# Patient Record
Sex: Female | Born: 2000 | Race: White | Hispanic: No | Smoking: Never smoker
Health system: Southern US, Community
[De-identification: ages and names within clinical notes are randomized; demographics above are authoritative.]

## PROBLEM LIST (undated history)

## (undated) ENCOUNTER — Inpatient Hospital Stay (HOSPITAL_COMMUNITY): Payer: Self-pay

## (undated) DIAGNOSIS — F609 Personality disorder, unspecified: Secondary | ICD-10-CM

## (undated) DIAGNOSIS — F4001 Agoraphobia with panic disorder: Secondary | ICD-10-CM

## (undated) DIAGNOSIS — F419 Anxiety disorder, unspecified: Secondary | ICD-10-CM

## (undated) DIAGNOSIS — F909 Attention-deficit hyperactivity disorder, unspecified type: Secondary | ICD-10-CM

## (undated) DIAGNOSIS — F959 Tic disorder, unspecified: Secondary | ICD-10-CM

## (undated) DIAGNOSIS — F32A Depression, unspecified: Secondary | ICD-10-CM

## (undated) DIAGNOSIS — F502 Bulimia nervosa, unspecified: Secondary | ICD-10-CM

## (undated) DIAGNOSIS — F329 Major depressive disorder, single episode, unspecified: Secondary | ICD-10-CM

## (undated) DIAGNOSIS — F4310 Post-traumatic stress disorder, unspecified: Secondary | ICD-10-CM

## (undated) DIAGNOSIS — K589 Irritable bowel syndrome without diarrhea: Secondary | ICD-10-CM

## (undated) HISTORY — DX: Irritable bowel syndrome, unspecified: K58.9

## (undated) HISTORY — DX: Anxiety disorder, unspecified: F41.9

## (undated) HISTORY — DX: Agoraphobia with panic disorder: F40.01

## (undated) HISTORY — DX: Attention-deficit hyperactivity disorder, unspecified type: F90.9

## (undated) HISTORY — DX: Personality disorder, unspecified: F60.9

## (undated) HISTORY — PX: CHOLECYSTECTOMY: SHX55

## (undated) HISTORY — PX: OTHER SURGICAL HISTORY: SHX169

## (undated) HISTORY — DX: Post-traumatic stress disorder, unspecified: F43.10

## (undated) HISTORY — DX: Depression, unspecified: F32.A

## (undated) HISTORY — DX: Bulimia nervosa, unspecified: F50.20

## (undated) HISTORY — DX: Major depressive disorder, single episode, unspecified: F32.9

## (undated) HISTORY — DX: Tic disorder, unspecified: F95.9

## (undated) HISTORY — PX: TYMPANOSTOMY TUBE PLACEMENT: SHX32

## (undated) HISTORY — PX: COLONOSCOPY: SHX174

---
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
## 2022-01-07 ENCOUNTER — Ambulatory Visit: Payer: Self-pay

## 2022-01-18 DIAGNOSIS — Z419 Encounter for procedure for purposes other than remedying health state, unspecified: Secondary | ICD-10-CM | POA: Diagnosis not present

## 2022-02-16 ENCOUNTER — Ambulatory Visit: Payer: 59 | Admitting: Adult Health

## 2022-02-17 ENCOUNTER — Ambulatory Visit: Payer: Medicaid Other | Admitting: Adult Health

## 2022-02-18 DIAGNOSIS — Z419 Encounter for procedure for purposes other than remedying health state, unspecified: Secondary | ICD-10-CM | POA: Diagnosis not present

## 2022-03-19 DIAGNOSIS — Z419 Encounter for procedure for purposes other than remedying health state, unspecified: Secondary | ICD-10-CM | POA: Diagnosis not present

## 2022-04-04 ENCOUNTER — Ambulatory Visit: Payer: Self-pay

## 2022-04-05 ENCOUNTER — Telehealth: Payer: Self-pay | Admitting: Family Medicine

## 2022-04-05 ENCOUNTER — Other Ambulatory Visit: Payer: Self-pay | Admitting: *Deleted

## 2022-04-05 NOTE — Telephone Encounter (Signed)
Patient had symptoms on 04/02/22  congested,cough high fever ,this morning still congested did a home tested this morning positive for Covid. [place]

## 2022-04-05 NOTE — Telephone Encounter (Signed)
Nurses Please do a thorough triage regarding her symptoms She may have a prescription for albuterol 2 puffs every 4 hours as needed Please document if she is having significant breathing difficulties, passing out anything alarming she should be checked right away here or ER Otherwise most individuals with COVID gradually get better without Paxlovid at her age, if she feels she needs to be seen please let me know we can accommodate as best as possible Otherwise I would recommend home care over the next several days it is wise to stay away from others and wear a mask when around others for the next 10 days if she needs a work note please give her a work note

## 2022-04-05 NOTE — Telephone Encounter (Signed)
Patient states she did get Paxlovid via telehealth visit and is having SOB or losing her breath when she sit up or tries to do anything but typically has this issue when she gets sick

## 2022-04-05 NOTE — Telephone Encounter (Signed)
Patient scheduled office visit 04/06/22 at 11:40 pm with Dr Nicki Reaper

## 2022-04-05 NOTE — Telephone Encounter (Signed)
Pt is wanting to know if a inhaler can be called in

## 2022-04-05 NOTE — Telephone Encounter (Signed)
11:40 AM on Tuesday is available to be seen thank you

## 2022-04-06 ENCOUNTER — Ambulatory Visit (INDEPENDENT_AMBULATORY_CARE_PROVIDER_SITE_OTHER): Payer: Medicaid Other | Admitting: Family Medicine

## 2022-04-06 DIAGNOSIS — U071 COVID-19: Secondary | ICD-10-CM

## 2022-04-06 NOTE — Progress Notes (Signed)
   Subjective:    Patient ID: Toni Moss, female    DOB: [birth year], 22 y.o.   MRN: [id number]  Tested positive for Covid yesterday.   Headache  The current episode started yesterday. The problem has been unchanged.  Sore Throat  The current episode started today. There has been no fever. Associated symptoms include headaches and shortness of breath.  Viral-like illness Tested positive for COVID Symptoms started last Friday She relates heart rates races with movement.  She also relates that she feels a little short winded when she tries to do too much denies high fever chills    Review of Systems  Respiratory:  Positive for shortness of breath.   Neurological:  Positive for headaches.       Objective:   Physical Exam Gen-NAD not toxic TMS-normal bilateral T- normal no redness Chest-CTA respiratory rate normal no crackles CV RRR no murmur Skin-warm dry Neuro-grossly normal  Patient having some intermittent spells of tachycardia intermittent spells of feeling short winded both of these are consistent with her COVID O2 saturation 99% heart rate normal respiratory rate normal       Assessment & Plan:  COVID infection Already on Paxlovid No sign of any type of respiratory distress or problems Continue current measures Keep all regular follow-up visits Warning signs were discussed

## 2022-04-19 DIAGNOSIS — Z419 Encounter for procedure for purposes other than remedying health state, unspecified: Secondary | ICD-10-CM | POA: Diagnosis not present

## 2022-05-19 DIAGNOSIS — Z419 Encounter for procedure for purposes other than remedying health state, unspecified: Secondary | ICD-10-CM | POA: Diagnosis not present

## 2022-06-11 ENCOUNTER — Ambulatory Visit (INDEPENDENT_AMBULATORY_CARE_PROVIDER_SITE_OTHER): Payer: Managed Care, Other (non HMO) | Admitting: Adult Health

## 2022-06-11 ENCOUNTER — Encounter: Payer: Self-pay | Admitting: Adult Health

## 2022-06-11 VITALS — BP 113/73 | HR 88 | Ht 65.0 in | Wt 189.5 lb

## 2022-06-11 DIAGNOSIS — L02224 Furuncle of groin: Secondary | ICD-10-CM

## 2022-06-11 DIAGNOSIS — N921 Excessive and frequent menstruation with irregular cycle: Secondary | ICD-10-CM

## 2022-06-11 DIAGNOSIS — Z3202 Encounter for pregnancy test, result negative: Secondary | ICD-10-CM | POA: Diagnosis not present

## 2022-06-11 LAB — POCT URINE PREGNANCY: Preg Test, Ur: NEGATIVE

## 2022-06-11 MED ORDER — SULFAMETHOXAZOLE-TRIMETHOPRIM 800-160 MG PO TABS: 1.0000 | ORAL_TABLET | Freq: Two times a day (BID) | ORAL | 0 refills | Status: DC

## 2022-06-11 MED ORDER — MEGESTROL ACETATE 40 MG PO TABS: 40.0000 mg | ORAL_TABLET | Freq: Two times a day (BID) | ORAL | 3 refills | Status: DC

## 2022-06-11 MED ORDER — SILVER SULFADIAZINE 1 % EX CREA: 1.0000 | TOPICAL_CREAM | Freq: Two times a day (BID) | CUTANEOUS | 0 refills | Status: DC

## 2022-06-11 NOTE — Addendum Note (Signed)
Addended by: Colen Darling on: 06/11/2022 11:12 AM   Modules accepted: Orders

## 2022-06-11 NOTE — Progress Notes (Signed)
  Subjective:     Patient ID: Toni Moss, female   DOB: 20-Nov-2000, 20 y.o.   MRN: [id number]  HPI Jateria is a 22 year old white female,single, G0P0 in complaining of spotting between periods and periods last 4 days but heavy, and has bump in groin area.  Review of Systems +spotting between periods + periods last 4 days but heavy,  + bump in groin area. Reviewed past medical,surgical, social and family history. Reviewed medications and allergies.     Objective:   Physical Exam BP 113/73 (BP Location: Left Arm, Patient Position: Sitting, Cuff Size: Normal)   Pulse 88   Ht 5\' 5"  (1.651 m)   Wt 189 lb 8 oz (86 kg)   LMP 06/10/2022   BMI 31.53 kg/m   UPT is negative. Skin warm and dry.Pelvic: external genitalia is normal in appearance, has 1.5 cm boil, near base left labia, draining purulent blood, culture obtained vagina: +blood,urethra has no lesions or masses noted, cervix:smooth, uterus: normal size, shape and contour, non tender, no masses felt, adnexa: no masses or tenderness noted. Bladder is non tender and no masses felt.    Fall risk is low  [facility] - 06/11/22 1028       Pregnancy Intention Screening   Does the patient want to become pregnant in the next year? Ok Either Way    Does the patient's partner want to become pregnant in the next year? Ok Either Way    Would the patient like to discuss contraceptive options today? No      Contraception Wrap Up   Current Method Abstinence;Female Condom    End Method Abstinence;Female Condom            Examination chaperoned by Malachy Mood LPN   Assessment:     1. Pregnancy examination or test, negative result  2. Irregular intermenstrual bleeding Spotting between periods  3. Menorrhagia with irregular cycle Periods heavy  Last about 4 days Declines birth control  Follow up prn  4. Boil of groin 1.5 cm boil, draining Culture sent Use warm compress, can massage but do not squeeze Will rx septra ds and  silvadene   Meds ordered this encounter  Medications   sulfamethoxazole-trimethoprim (BACTRIM DS) 800-160 MG tablet    Sig: Take 1 tablet by mouth 2 (two) times daily. Take 1 bid    Dispense:  28 tablet    Refill:  0    Order Specific Question:   Supervising Provider    Answer:   Duane Lope H [[id number]]   silver sulfADIAZINE (SILVADENE) 1 % cream    Sig: Apply 1 Application topically 2 (two) times daily.    Dispense:  50 g    Refill:  0    Order Specific Question:   Supervising Provider    Answer:   Duane Lope H [[id number]]   megestrol (MEGACE) 40 MG tablet    Sig: Take 1 tablet (40 mg total) by mouth 2 (two) times daily.    Dispense:  60 tablet    Refill:  3    Order Specific Question:   Supervising Provider    Answer:   Lazaro Arms [[id number]]       Plan:     Follow up prn

## 2022-06-15 LAB — WOUND CULTURE: Organism ID, Bacteria: NONE SEEN

## 2022-06-19 DIAGNOSIS — Z419 Encounter for procedure for purposes other than remedying health state, unspecified: Secondary | ICD-10-CM | POA: Diagnosis not present

## 2022-07-19 DIAGNOSIS — Z419 Encounter for procedure for purposes other than remedying health state, unspecified: Secondary | ICD-10-CM | POA: Diagnosis not present

## 2022-08-05 ENCOUNTER — Telehealth: Payer: Self-pay

## 2022-08-05 NOTE — Telephone Encounter (Signed)
PT is calling having problems with IBS this time the constipation part and wants to know if medication can be sent or advice on what she needs to do?   Toni Moss [phone]

## 2022-08-06 NOTE — Telephone Encounter (Signed)
Recommend follow-up office visit  May increase fiber into the diet such as fiber Gummies daily or MiraLAX one half capful daily in 8 ounces of water  May schedule follow-up office visit to discuss in detail other options

## 2022-08-09 NOTE — Telephone Encounter (Signed)
I spoke with the patient, to she says her last BM was one and half weeks ago, she has been using miralax 3 times a week, she is available for a follow up anywhere from 8/5 to 8/7, please advise

## 2022-08-09 NOTE — Telephone Encounter (Signed)
The goal is to have soft bowel movements on a regular basis at least once every couple days I would recommend if she is only having 1 bowel movement every 7 to 10 days increase the MiraLAX to be daily, also recommend gummy fibers take 2 daily, plenty of water daily, follow-up office visit May use an open slot that accommodates her schedule thank you

## 2022-08-10 NOTE — Telephone Encounter (Signed)
Please schedule an appt for patient per Dr request from 08/23/22 to 08/25/22 , thanks

## 2022-08-19 DIAGNOSIS — Z419 Encounter for procedure for purposes other than remedying health state, unspecified: Secondary | ICD-10-CM | POA: Diagnosis not present

## 2022-08-23 ENCOUNTER — Ambulatory Visit: Payer: Managed Care, Other (non HMO) | Admitting: Family Medicine

## 2022-09-13 ENCOUNTER — Ambulatory Visit: Payer: Managed Care, Other (non HMO) | Admitting: Adult Health

## 2022-09-15 ENCOUNTER — Ambulatory Visit: Payer: Managed Care, Other (non HMO) | Admitting: Adult Health

## 2022-09-19 DIAGNOSIS — Z419 Encounter for procedure for purposes other than remedying health state, unspecified: Secondary | ICD-10-CM | POA: Diagnosis not present

## 2022-09-24 DIAGNOSIS — F331 Major depressive disorder, recurrent, moderate: Secondary | ICD-10-CM | POA: Diagnosis not present

## 2022-10-19 DIAGNOSIS — Z419 Encounter for procedure for purposes other than remedying health state, unspecified: Secondary | ICD-10-CM | POA: Diagnosis not present

## 2022-10-26 ENCOUNTER — Telehealth (HOSPITAL_COMMUNITY): Payer: Self-pay

## 2022-10-26 NOTE — Telephone Encounter (Signed)
Called to confirm 10/28/22 lvm to confirm by 12:00 pm 10/27/22

## 2022-10-28 ENCOUNTER — Ambulatory Visit (INDEPENDENT_AMBULATORY_CARE_PROVIDER_SITE_OTHER): Payer: Managed Care, Other (non HMO) | Admitting: Psychiatry

## 2022-10-28 ENCOUNTER — Encounter (HOSPITAL_COMMUNITY): Payer: Self-pay | Admitting: Psychiatry

## 2022-10-28 DIAGNOSIS — F411 Generalized anxiety disorder: Secondary | ICD-10-CM | POA: Diagnosis not present

## 2022-10-28 DIAGNOSIS — F15982 Other stimulant use, unspecified with stimulant-induced sleep disorder: Secondary | ICD-10-CM

## 2022-10-28 DIAGNOSIS — F4001 Agoraphobia with panic disorder: Secondary | ICD-10-CM | POA: Diagnosis not present

## 2022-10-28 DIAGNOSIS — Z6981 Encounter for mental health services for victim of other abuse: Secondary | ICD-10-CM | POA: Diagnosis not present

## 2022-10-28 DIAGNOSIS — F603 Borderline personality disorder: Secondary | ICD-10-CM | POA: Diagnosis not present

## 2022-10-28 DIAGNOSIS — F502 Bulimia nervosa, unspecified: Secondary | ICD-10-CM | POA: Insufficient documentation

## 2022-10-28 DIAGNOSIS — F332 Major depressive disorder, recurrent severe without psychotic features: Secondary | ICD-10-CM | POA: Diagnosis not present

## 2022-10-28 DIAGNOSIS — Z789 Other specified health status: Secondary | ICD-10-CM

## 2022-10-28 DIAGNOSIS — F5022 Bulimia nervosa, moderate: Secondary | ICD-10-CM

## 2022-10-28 DIAGNOSIS — F3341 Major depressive disorder, recurrent, in partial remission: Secondary | ICD-10-CM | POA: Insufficient documentation

## 2022-10-28 DIAGNOSIS — F330 Major depressive disorder, recurrent, mild: Secondary | ICD-10-CM | POA: Insufficient documentation

## 2022-10-28 HISTORY — DX: Other specified health status: Z78.9

## 2022-10-28 HISTORY — DX: Other stimulant use, unspecified with stimulant-induced sleep disorder: F15.982

## 2022-10-28 MED ORDER — LAMOTRIGINE 100 MG PO TABS
100.0000 mg | ORAL_TABLET | Freq: Every evening | ORAL | 1 refills | Status: DC
Start: 2022-11-26 — End: 2022-12-09

## 2022-10-28 MED ORDER — LAMOTRIGINE 25 MG PO TABS
ORAL_TABLET | ORAL | 0 refills | Status: DC
Start: 2022-10-28 — End: 2022-11-24
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
## 2022-10-28 NOTE — Patient Instructions (Signed)
We started lamotrigine 25 mg nightly for 2 weeks.  Then increase to 50 mg nightly for 2 weeks.  At that point you will be able to get the new prescription with plan to increase to 100 mg nightly thereafter.  As we discussed cutting back on the caffeine will have a huge improvement for your anxiety and ability to fall asleep and stay asleep at night.  Start by cutting back by half a unit (unit is a cup, bottle, can etc.) every 5 days until you are consuming no more than 8 to 12 ounces first thing in the morning.  If you have any of the withdrawal headaches you can have sips but do not go back to drinking the past amount.  I would start with the caffeine consumption as occurring closest to bedtime.  I will coordinate with your PCP to get a nutrition referral for the bulimia as well as some lab work to see how you are physically doing.  Please contact your insurer to find a CBT (cognitive behavioral therapy) provider that has either experience with trauma or eating disorders.

## 2022-10-31 ENCOUNTER — Telehealth: Payer: Self-pay | Admitting: Family Medicine

## 2022-10-31 NOTE — Telephone Encounter (Signed)
-----   Message from Elsie Lincoln sent at 10/28/2022  2:30 PM EDT ----- Dr. Gerda Diss,  Thank you for taking care of [facility].  In talking to her, it seems like she has a bulimia diagnosis so would you please be able to place a nutrition referral, vitamin D, B12, folate, iron panel, CBC, CBC, phosphorus, magnesium, EKG, and obtain orthostatic vital signs and blind weights at follow-ups?  She is having some orthostasis and feels faint in the shower and with her OCP use I am not sure if she is having amenorrhea or not.  I also encouraged her to cut back on caffeine so I do not know that we need to place a sleep study referral yet but I will monitor to see if this improves with cutting back on caffeine.  She is having restless legs which is why the iron panel would be helpful.  Thanks, Sam

## 2022-10-31 NOTE — Telephone Encounter (Signed)
Dr Adrian Blackwater Is requesting a moderate amount of labs and EKG with orthostatic vital signs. This would require a follow-up office visit with myself or Eber Jones to complete Please arrange accordingly-nonemergent

## 2022-11-02 NOTE — Telephone Encounter (Signed)
Pt has been called to inform of appt on 11/04/2022 no answer

## 2022-11-03 NOTE — Telephone Encounter (Signed)
Pt has been called to inform of appt on 11/04/2022 no answer

## 2022-11-04 ENCOUNTER — Encounter: Payer: Self-pay | Admitting: Nurse Practitioner

## 2022-11-04 ENCOUNTER — Ambulatory Visit: Payer: Managed Care, Other (non HMO) | Admitting: Nurse Practitioner

## 2022-11-04 VITALS — BP 102/72 | HR 88 | Temp 98.2°F | Ht 65.01 in | Wt 200.8 lb

## 2022-11-04 DIAGNOSIS — Z130 Encounter for screening for diseases of the blood and blood-forming organs and certain disorders involving the immune mechanism: Secondary | ICD-10-CM

## 2022-11-04 DIAGNOSIS — Z13220 Encounter for screening for lipoid disorders: Secondary | ICD-10-CM

## 2022-11-04 DIAGNOSIS — K2100 Gastro-esophageal reflux disease with esophagitis, without bleeding: Secondary | ICD-10-CM

## 2022-11-04 DIAGNOSIS — Z13228 Encounter for screening for other metabolic disorders: Secondary | ICD-10-CM

## 2022-11-04 DIAGNOSIS — Z131 Encounter for screening for diabetes mellitus: Secondary | ICD-10-CM

## 2022-11-04 DIAGNOSIS — R635 Abnormal weight gain: Secondary | ICD-10-CM | POA: Insufficient documentation

## 2022-11-04 DIAGNOSIS — K5909 Other constipation: Secondary | ICD-10-CM

## 2022-11-04 DIAGNOSIS — Z1329 Encounter for screening for other suspected endocrine disorder: Secondary | ICD-10-CM

## 2022-11-04 DIAGNOSIS — E669 Obesity, unspecified: Secondary | ICD-10-CM | POA: Insufficient documentation

## 2022-11-04 MED ORDER — LUBIPROSTONE 24 MCG PO CAPS: 24.0000 ug | ORAL_CAPSULE | Freq: Two times a day (BID) | ORAL | 0 refills | Status: DC

## 2022-11-04 MED ORDER — PANTOPRAZOLE SODIUM 40 MG PO TBEC: 40.0000 mg | DELAYED_RELEASE_TABLET | Freq: Every day | ORAL | 2 refills | Status: DC

## 2022-11-04 NOTE — Progress Notes (Signed)
Subjective:    Patient ID: Toni Moss, female    DOB: 03-07-2000, 22 y.o.   MRN: [id number]  HPI Presents to discuss her eating habits.  Is being followed by psychiatry for several issues including borderline personality disorder.  Was recently started on Lamictal which seems to decrease her appetite.  Has stress eating as well as binge eating.  Has been under more stress lately.  Has a chronic history of constipation going back for months.  Only has a BM about every 2 weeks.  Usually a very large BM at that time.  No blood noted.  Eats a high-fiber diet.  Last BM a couple of days ago.  GERD seems to be under control, will occasionally have some cough after eating foods such as rice or bread.  Has been on medication for GERD before, has seen GI specialist.  Works as a Clinical biochemist.  Has been going to the gym 2 times per week.  Denies difficulty sleeping although this is noted on her PHQ.  Requesting routine lab work and referral to a dietitian.     11/04/2022    9:34 AM  Depression screen PHQ 2/9  Decreased Interest 3  Down, Depressed, Hopeless 3  PHQ - 2 Score 6  Altered sleeping 3  Tired, decreased energy 3  Change in appetite 3  Feeling bad or failure about yourself  3  Trouble concentrating 3  Moving slowly or fidgety/restless 3  Suicidal thoughts 0  PHQ-9 Score 24  Difficult doing work/chores Extremely dIfficult      11/04/2022    9:35 AM 12/02/2021    3:01 PM  GAD 7 : Generalized Anxiety Score  Nervous, Anxious, on Edge 3 3  Control/stop worrying 3 3  Worry too much - different things 3 3  Trouble relaxing 3 1  Restless 2 2  Easily annoyed or irritable 3 3  Afraid - awful might happen 2 2  Total GAD 7 Score 19 17  Anxiety Difficulty Extremely difficult Very difficult      Review of Systems  HENT:  Negative for sore throat and trouble swallowing.   Respiratory:  Negative for cough, chest tightness and shortness of breath.   Gastrointestinal:  Positive for abdominal pain  and constipation. Negative for blood in stool and diarrhea.  Psychiatric/Behavioral:  Positive for dysphoric mood. Negative for self-injury, sleep disturbance and suicidal ideas. The patient is nervous/anxious.        Objective:   Physical Exam NAD.  Alert, oriented.  Making good eye contact.  Mildly anxious affect.  Dressed appropriately for the weather.  Speech clear.  Thoughts logical coherent and relevant.  Thyroid nontender to palpation, no mass or goiter noted.  Lungs clear.  Heart regular rate rhythm.  Abdomen soft nondistended with active bowel sounds x 4; mild mid epigastric area tenderness to deep palpation, otherwise nontender.  No rebound or guarding.  No obvious masses. Today's Vitals   11/04/22 0930  BP: 102/72  Pulse: 88  Temp: 98.2 F (36.8 C)  SpO2: 99%  Weight: 200 lb 12.8 oz (91.1 kg)  Height: 5' 5.01" (1.651 m)   Body mass index is 33.4 kg/m.        Assessment & Plan:   Problem List Items Addressed This Visit       Digestive   Chronic constipation   Gastroesophageal reflux disease with esophagitis without hemorrhage - Primary   Relevant Orders   CBC with Differential/Platelet     Other  Obesity (BMI 30-39.9)   Relevant Orders   Comprehensive metabolic panel   Hemoglobin A1c   Lipid panel   TSH   Referral to Nutrition and Diabetes Services   Weight gain   Relevant Orders   Comprehensive metabolic panel   Hemoglobin A1c   Lipid panel   TSH   Referral to Nutrition and Diabetes Services   Other Visit Diagnoses     Screening for deficiency anemia       Relevant Orders   CBC with Differential/Platelet   Screening for metabolic disorder       Relevant Orders   Comprehensive metabolic panel   Screening for diabetes mellitus       Relevant Orders   Hemoglobin A1c   Screening for lipid disorders       Relevant Orders   Lipid panel   Screening for thyroid disorder       Relevant Orders   TSH         Meds ordered this encounter   Medications   pantoprazole (PROTONIX) 40 MG tablet    Sig: Take 1 tablet (40 mg total) by mouth daily. Prn acid reflux    Dispense:  30 tablet    Refill:  2    Order Specific Question:   Supervising Provider    Answer:   Lilyan Punt A [[id number]]   lubiprostone (AMITIZA) 24 MCG capsule    Sig: Take 1 capsule (24 mcg total) by mouth 2 (two) times daily with a meal. For constipation    Dispense:  60 capsule    Refill:  0    Order Specific Question:   Supervising Provider    Answer:   Lilyan Punt A [[id number]]   Recommend daily probiotic.  Given written and verbal information on constipation.  Trial of Amitiza as directed for chronic constipation.  Restart pantoprazole for the next few weeks until epigastric discomfort has resolved.  Patient to call back in that time if no improvement in her current symptoms.  Will refer to dietitian for evaluation and consultation. Return if symptoms worsen or fail to improve.

## 2022-11-04 NOTE — Patient Instructions (Addendum)
Floragen refrigerated probiotic   Constipation, Adult Constipation is when a person has fewer than three bowel movements in a week, has difficulty having a bowel movement, or has stools (feces) that are dry, hard, or larger than normal. Constipation may be caused by an underlying condition. It may become worse with age if a person takes certain medicines and does not take in enough fluids. Follow these instructions at home: Eating and drinking  Eat foods that have a lot of fiber, such as beans, whole grains, and fresh fruits and vegetables. Limit foods that are low in fiber and high in fat and processed sugars, such as fried or sweet foods. These include french fries, hamburgers, cookies, candies, and soda. Drink enough fluid to keep your urine pale yellow. General instructions Exercise regularly or as told by your health care provider. Try to do 150 minutes of moderate exercise each week. Use the bathroom when you have the urge to go. Do not hold it in. Take over-the-counter and prescription medicines only as told by your health care provider. This includes any fiber supplements. During bowel movements: Practice deep breathing while relaxing the lower abdomen. Practice pelvic floor relaxation. Watch your condition for any changes. Let your health care provider know about them. Keep all follow-up visits as told by your health care provider. This is important. Contact a health care provider if: You have pain that gets worse. You have a fever. You do not have a bowel movement after 4 days. You vomit. You are not hungry or you lose weight. You are bleeding from the opening between the buttocks (anus). You have thin, pencil-like stools. Get help right away if: You have a fever and your symptoms suddenly get worse. You leak stool or have blood in your stool. Your abdomen is bloated. You have severe pain in your abdomen. You feel dizzy or you faint. Summary Constipation is when a person  has fewer than three bowel movements in a week, has difficulty having a bowel movement, or has stools (feces) that are dry, hard, or larger than normal. Eat foods that have a lot of fiber, such as beans, whole grains, and fresh fruits and vegetables. Drink enough fluid to keep your urine pale yellow. Take over-the-counter and prescription medicines only as told by your health care provider. This includes any fiber supplements. This information is not intended to replace advice given to you by your health care provider. Make sure you discuss any questions you have with your health care provider. Document Revised: 11/18/2021 Document Reviewed: 11/18/2021 Elsevier Patient Education  2024 [employer].

## 2022-11-05 LAB — CBC WITH DIFFERENTIAL/PLATELET
Basophils Absolute: 0 10*3/uL (ref 0.0–0.2)
Basos: 0 %
EOS (ABSOLUTE): 0.2 10*3/uL (ref 0.0–0.4)
Eos: 2 %
Hematocrit: 45.2 % (ref 34.0–46.6)
Hemoglobin: 14.3 g/dL (ref 11.1–15.9)
Immature Grans (Abs): 0 10*3/uL (ref 0.0–0.1)
Immature Granulocytes: 0 %
Lymphocytes Absolute: 3.1 10*3/uL (ref 0.7–3.1)
Lymphs: 29 %
MCH: 29.1 pg (ref 26.6–33.0)
MCHC: 31.6 g/dL (ref 31.5–35.7)
MCV: 92 fL (ref 79–97)
Monocytes Absolute: 0.6 10*3/uL (ref 0.1–0.9)
Monocytes: 6 %
Neutrophils Absolute: 6.5 10*3/uL (ref 1.4–7.0)
Neutrophils: 63 %
Platelets: 370 10*3/uL (ref 150–450)
RBC: 4.91 x10E6/uL (ref 3.77–5.28)
RDW: 11.8 % (ref 11.7–15.4)
WBC: 10.5 10*3/uL (ref 3.4–10.8)

## 2022-11-05 LAB — COMPREHENSIVE METABOLIC PANEL
ALT: 10 [IU]/L (ref 0–32)
AST: 17 [IU]/L (ref 0–40)
Albumin: 4.4 g/dL (ref 4.0–5.0)
Alkaline Phosphatase: 76 [IU]/L (ref 42–106)
BUN/Creatinine Ratio: 14 (ref 9–23)
BUN: 10 mg/dL (ref 6–20)
Bilirubin Total: <0.2 mg/dL (ref 0.0–1.2)
CO2: 22 mmol/L (ref 20–29)
Calcium: 9.5 mg/dL (ref 8.7–10.2)
Chloride: 101 mmol/L (ref 96–106)
Creatinine, Ser: 0.74 mg/dL (ref 0.57–1.00)
Globulin, Total: 2.8 g/dL (ref 1.5–4.5)
Glucose: 101 mg/dL — ABNORMAL HIGH (ref 70–99)
Potassium: 4.6 mmol/L (ref 3.5–5.2)
Sodium: 139 mmol/L (ref 134–144)
Total Protein: 7.2 g/dL (ref 6.0–8.5)
eGFR: 119 mL/min/{1.73_m2} (ref >59)

## 2022-11-05 LAB — LIPID PANEL
Chol/HDL Ratio: 2.8 {ratio} (ref 0.0–4.4)
Cholesterol, Total: 148 mg/dL (ref 100–169)
HDL: 52 mg/dL (ref >39)
LDL Chol Calc (NIH): 84 mg/dL (ref 0–109)
Triglycerides: 57 mg/dL (ref 0–89)
VLDL Cholesterol Cal: 12 mg/dL (ref 5–40)

## 2022-11-05 LAB — HEMOGLOBIN A1C
Est. average glucose Bld gHb Est-mCnc: 105 mg/dL
Hgb A1c MFr Bld: 5.3 % (ref 4.8–5.6)

## 2022-11-05 LAB — TSH: TSH: 0.975 u[IU]/mL (ref 0.450–4.500)

## 2022-11-19 DIAGNOSIS — Z419 Encounter for procedure for purposes other than remedying health state, unspecified: Secondary | ICD-10-CM | POA: Diagnosis not present

## 2022-11-24 ENCOUNTER — Encounter: Payer: Self-pay | Admitting: Adult Health

## 2022-11-24 ENCOUNTER — Ambulatory Visit: Payer: Managed Care, Other (non HMO) | Admitting: Adult Health

## 2022-11-24 VITALS — BP 123/82 | HR 114 | Ht 65.0 in | Wt 200.5 lb

## 2022-11-24 DIAGNOSIS — Z3202 Encounter for pregnancy test, result negative: Secondary | ICD-10-CM | POA: Diagnosis not present

## 2022-11-24 DIAGNOSIS — Z3490 Encounter for supervision of normal pregnancy, unspecified, unspecified trimester: Secondary | ICD-10-CM | POA: Insufficient documentation

## 2022-11-24 DIAGNOSIS — Z113 Encounter for screening for infections with a predominantly sexual mode of transmission: Secondary | ICD-10-CM | POA: Insufficient documentation

## 2022-11-24 DIAGNOSIS — Z8619 Personal history of other infectious and parasitic diseases: Secondary | ICD-10-CM | POA: Diagnosis not present

## 2022-11-24 DIAGNOSIS — N911 Secondary amenorrhea: Secondary | ICD-10-CM | POA: Insufficient documentation

## 2022-11-24 DIAGNOSIS — Z319 Encounter for procreative management, unspecified: Secondary | ICD-10-CM

## 2022-11-24 LAB — POCT URINE PREGNANCY: Preg Test, Ur: NEGATIVE

## 2022-11-24 MED ORDER — MEDROXYPROGESTERONE ACETATE 10 MG PO TABS: ORAL_TABLET | ORAL | 0 refills | Status: DC

## 2022-11-24 NOTE — Progress Notes (Signed)
  Subjective:     Patient ID: Toni Moss, female   DOB: 2000-07-29, 22 y.o.   MRN: [id number]  HPI Toni Moss is a 22 year old white female,with SO, G0P0, in wanting to discuss getting pregnant in the future. She has not had a period since 06/10/22.   PCP is Dr Lilyan Punt  Review of Systems No period since 06/10/22 Periods used to be regular then had nexplanon and after is was removed they have been irregular +nipples sore Reviewed past medical,surgical, social and family history. Reviewed medications and allergies.     Objective:   Physical Exam BP 123/82 (BP Location: Left Arm, Patient Position: Sitting, Cuff Size: Normal)   Pulse (!) 114   Ht 5\' 5"  (1.651 m)   Wt 200 lb 8 oz (90.9 kg)   LMP 06/10/2022 Comment: takes Megace prn  BMI 33.36 kg/m  UPT is negative Skin warm and dry.  Lungs: clear to ausculation bilaterally. Cardiovascular: regular rate and rhythm.    Fall risk is low  Upstream - 11/24/22 1406       Pregnancy Intention Screening   Does the patient want to become pregnant in the next year? Yes    Does the patient's partner want to become pregnant in the next year? Yes    Would the patient like to discuss contraceptive options today? No      Contraception Wrap Up   Current Method Pregnant/Seeking Pregnancy    End Method Pregnant/Seeking Pregnancy    Contraception Counseling Provided No             Assessment:     1. Screening examination for STD (sexually transmitted disease) GC/CHL sent on urine  - GC/Chlamydia Probe Amp  2. History of chlamydia GC/CHL sent on urine  - GC/Chlamydia Probe Amp  3. Secondary amenorrhea LMP 06/10/22 UPT was negative  Will rx provera 10 mg 1 daily for 10 days to get withdrawal bleed She had labs 11/04/22 TSH was 0.975, A1c was 5.3, glucose was 101.Lipids and Blood count good. - POCT urine pregnancy  4. Patient desires pregnancy     Plan:     Return about 12/13/22 for ROS and see if had withdrawal bleed or not

## 2022-11-27 LAB — GC/CHLAMYDIA PROBE AMP
Chlamydia trachomatis, NAA: NEGATIVE
Neisseria Gonorrhoeae by PCR: NEGATIVE

## 2022-12-02 ENCOUNTER — Ambulatory Visit: Payer: Managed Care, Other (non HMO) | Admitting: Nurse Practitioner

## 2022-12-09 ENCOUNTER — Telehealth (INDEPENDENT_AMBULATORY_CARE_PROVIDER_SITE_OTHER): Payer: Managed Care, Other (non HMO) | Admitting: Psychiatry

## 2022-12-09 ENCOUNTER — Encounter (HOSPITAL_COMMUNITY): Payer: Self-pay | Admitting: Psychiatry

## 2022-12-09 DIAGNOSIS — F603 Borderline personality disorder: Secondary | ICD-10-CM

## 2022-12-09 DIAGNOSIS — F4310 Post-traumatic stress disorder, unspecified: Secondary | ICD-10-CM | POA: Diagnosis not present

## 2022-12-09 DIAGNOSIS — F330 Major depressive disorder, recurrent, mild: Secondary | ICD-10-CM | POA: Diagnosis not present

## 2022-12-09 DIAGNOSIS — F5022 Bulimia nervosa, moderate: Secondary | ICD-10-CM

## 2022-12-09 DIAGNOSIS — F411 Generalized anxiety disorder: Secondary | ICD-10-CM

## 2022-12-09 MED ORDER — LAMOTRIGINE 150 MG PO TABS: 150.0000 mg | ORAL_TABLET | Freq: Every evening | ORAL | 0 refills | Status: DC

## 2022-12-09 MED ORDER — LAMOTRIGINE 200 MG PO TABS: 200.0000 mg | ORAL_TABLET | Freq: Every evening | ORAL | 2 refills | Status: DC

## 2022-12-09 NOTE — Patient Instructions (Addendum)
We increased the lamotrigine (Lamictal) to 150 mg nightly for the next 2 weeks.  Once a complete this dose you can increase to the 200 mg nightly dosing thereafter.  Keep up the good work with cutting back on caffeine and be sure to keep that nutrition appointment.  I am sorry the first psychotherapist did not work out for you but keep trying to get connected with one.  As we did just here is the fact sheet that corresponds with information we have available on lamotrigine in pregnancy: [email or web address].[email or web address]
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
## 2022-12-13 ENCOUNTER — Encounter: Payer: Self-pay | Admitting: Adult Health

## 2022-12-13 ENCOUNTER — Ambulatory Visit (INDEPENDENT_AMBULATORY_CARE_PROVIDER_SITE_OTHER): Payer: Managed Care, Other (non HMO) | Admitting: Adult Health

## 2022-12-13 VITALS — BP 119/76 | HR 102 | Ht 65.0 in | Wt 200.0 lb

## 2022-12-13 DIAGNOSIS — Z319 Encounter for procreative management, unspecified: Secondary | ICD-10-CM | POA: Diagnosis not present

## 2022-12-13 DIAGNOSIS — N911 Secondary amenorrhea: Secondary | ICD-10-CM

## 2022-12-13 NOTE — Progress Notes (Signed)
  Subjective:     Patient ID: Toni Moss, female   DOB: 03-Dec-2000, 22 y.o.   MRN: [id number]  HPI Haven is a 22 year old white female, with SO, G0P0, in back in follow up on taking provera to start a period, had not had one since, 06/10/22, and did bleed 12/07/22 to 12/09/22 and it was heavy. She does want to get pregnant in future.  PCP is Dr Lilyan Punt.  Review of Systems Did get period after taking provera Reviewed past medical,surgical, social and family history. Reviewed medications and allergies.     Objective:   Physical Exam BP 119/76 (BP Location: Left Arm, Patient Position: Sitting, Cuff Size: Normal)   Pulse (!) 102   Ht 5\' 5"  (1.651 m)   Wt 200 lb (90.7 kg)   LMP 12/07/2022   BMI 33.28 kg/m     Skin warm and dry. Lungs: clear to ausculation bilaterally. Cardiovascular: regular rate and rhythm.   Upstream - 12/13/22 1003       Pregnancy Intention Screening   Does the patient want to become pregnant in the next year? Ok Either Way    Does the patient's partner want to become pregnant in the next year? Ok Either Way    Would the patient like to discuss contraceptive options today? No      Contraception Wrap Up   Current Method No Contraceptive Precautions    End Method No Contraception Precautions    Contraception Counseling Provided No             Assessment:     1. Secondary amenorrhea Resolved, had period after taking provera Keep track of periods, if none in December let me know   2. Patient desires pregnancy Wants pregnancy in future Will check progesterone level 12/24/22 to see if ovulates Take OTC PNV with folic acid  - Progesterone     Plan:     Follow up prn

## 2022-12-19 ENCOUNTER — Encounter (HOSPITAL_COMMUNITY): Payer: Self-pay

## 2022-12-19 DIAGNOSIS — Z419 Encounter for procedure for purposes other than remedying health state, unspecified: Secondary | ICD-10-CM | POA: Diagnosis not present

## 2022-12-20 ENCOUNTER — Other Ambulatory Visit (HOSPITAL_COMMUNITY): Payer: Self-pay | Admitting: Psychiatry

## 2022-12-20 ENCOUNTER — Ambulatory Visit (INDEPENDENT_AMBULATORY_CARE_PROVIDER_SITE_OTHER): Payer: Managed Care, Other (non HMO) | Admitting: Family Medicine

## 2022-12-20 ENCOUNTER — Encounter: Payer: Managed Care, Other (non HMO) | Attending: Nurse Practitioner | Admitting: Nutrition

## 2022-12-20 VITALS — BP 114/72 | HR 92 | Temp 98.2°F | Ht 65.0 in | Wt 204.8 lb

## 2022-12-20 DIAGNOSIS — K5909 Other constipation: Secondary | ICD-10-CM

## 2022-12-20 DIAGNOSIS — F4310 Post-traumatic stress disorder, unspecified: Secondary | ICD-10-CM | POA: Diagnosis not present

## 2022-12-20 DIAGNOSIS — F4001 Agoraphobia with panic disorder: Secondary | ICD-10-CM

## 2022-12-20 DIAGNOSIS — F603 Borderline personality disorder: Secondary | ICD-10-CM

## 2022-12-20 DIAGNOSIS — K2100 Gastro-esophageal reflux disease with esophagitis, without bleeding: Secondary | ICD-10-CM | POA: Diagnosis not present

## 2022-12-20 DIAGNOSIS — F332 Major depressive disorder, recurrent severe without psychotic features: Secondary | ICD-10-CM

## 2022-12-20 MED ORDER — LAMOTRIGINE 100 MG PO TABS
100.0000 mg | ORAL_TABLET | Freq: Every evening | ORAL | 0 refills | Status: DC
Start: 2022-12-20 — End: 2023-02-08

## 2022-12-20 MED ORDER — LAMOTRIGINE 25 MG PO TABS: 25.0000 mg | ORAL_TABLET | Freq: Every evening | ORAL | 0 refills | Status: DC

## 2022-12-20 MED ORDER — PANTOPRAZOLE SODIUM 40 MG PO TBEC: 40.0000 mg | DELAYED_RELEASE_TABLET | Freq: Every day | ORAL | 8 refills | Status: DC

## 2022-12-20 NOTE — Progress Notes (Unsigned)
Medical Nutrition Therapy  Appointment Start time:  ***  Appointment End time:  ***  Primary concerns today: ***  Referral diagnosis: *** Preferred learning style: *** (auditory, visual, hands on, no preference indicated) Learning readiness: *** (not ready, contemplating, ready, change in progress)   NUTRITION ASSESSMENT  Started in 2018/12/28 [employer] 140-160 lbs Anxiety started when gma died in 12/27/2016. ADHD,Personality disorder, anxiety and depression and OCD and terrets.  Goes to the [facility] three times per day [employer] for [employer] care   Clinical Medical Hx: *** Medications: *** Labs: *** Notable Signs/Symptoms: ***  Lifestyle & Dietary Hx LIves with her boyfriend.  Estimated daily fluid intake: *** oz Supplements: *** Sleep: 6 hrs Stress / self-care: Variety of stress Current average weekly physical activity: Works out 3 times per week   24-Hr Dietary Recall Sodas- 2 Water 4-5 bottles of water Third Meal: 9 pm;  baked or grilled chicken small breast, macaroni, 1/2 c, Lemonade Snack:  Beverages: ***  Estimated Energy Needs Calories: *** Carbohydrate: ***g Protein: ***g Fat: ***g   NUTRITION DIAGNOSIS  {CHL AMB NUTRITIONAL DIAGNOSIS:[phone]}   NUTRITION INTERVENTION  Nutrition education (E-1) on the following topics:  ***  Handouts Provided Include  ***  Learning Style & Readiness for Change Teaching method utilized: Visual & Auditory  Demonstrated degree of understanding via: Teach Back  Barriers to learning/adherence to lifestyle change: ***  Goals Established by Pt ***   MONITORING & EVALUATION Dietary intake, weekly physical activity, and *** in ***.  Next Steps  Patient is to ***.

## 2022-12-20 NOTE — Progress Notes (Signed)
Patient with difficulty sleeping with titration of lamotrigine to 150 mg nightly from the 100 mg dose.  Will decrease to 125 mg nightly to see if this resolves.

## 2022-12-20 NOTE — Progress Notes (Addendum)
Subjective:    Patient ID: Toni Moss, female    DOB: 28-Aug-2000, 22 y.o.   MRN: [id number]  Discussed the use of AI scribe software for clinical note transcription with the patient, who gave verbal consent to proceed.  History of Present Illness   The patient, under the care of Dr. Gust Rung, is currently on a regimen of Lamictal, with the dosage being gradually increased to 200mg . The medication is being used to manage a range of mental health conditions including PTSD, borderline personality disorder, generalized anxiety, panic disorder, and major depressive disorder. The patient had previously been on a lower dose of the same medication but had discontinued it, believing she could manage her symptoms independently. However, she has since resumed the medication and reports it has been helpful.  The patient has been experiencing headaches, but these have decreased in frequency since starting the Lamictal. The only exception is when the dosage is increased, during which time she experiences temporary headaches. She has not needed to use her prescribed migraine medication, Maxalt, since starting the Lamictal.  The patient has been struggling with constipation, reporting bowel movements only once every two weeks. This has been an issue for approximately the past year. The Amitiza prescribed by another provider was not effective and has been discontinued. The patient describes her bowel movements as hard and firm when they do occur.  The patient also reports swelling in her ankles and feet, which she attributes to the Lamictal. The swelling does not seem to decrease even when she is active and moving around.  The patient is also dealing with heartburn and is on an acid blocker, which she takes daily. She is currently seeking a counselor and has been referred to [facility] for this purpose. She is also working with a nutritionist.  The patient works long hours in an urgent care setting, which she  finds stressful. 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 Despite these challenges, the patient reports that her stress levels are manageable.     Gastroesophageal reflux disease with esophagitis without hemorrhage  Chronic constipation  PTSD (post-traumatic stress disorder) - Plan: Ambulatory referral to Psychology  Borderline personality disorder (HCC) - Plan: Ambulatory referral to Psychology  Panic disorder with agoraphobia - Plan: Ambulatory referral to Psychology Patient under the care of Dr. Adrian Blackwater they are working with her medications She is doing some counseling as well     Review of Systems     Objective:    Physical Exam   EXTREMITIES: Edema in ankles and feet.           Assessment & Plan:  Assessment and Plan    Mental Health Disorders PTSD, borderline personality disorder, generalized anxiety disorder, panic disorder, and major depressive disorder. Currently on Lamictal with a plan to increase to 200mg . Reports improvement in symptoms since starting medication. -Continue Lamictal, titrating up to 200mg  as directed by Dr. Gust Rung. -Referral to counseling through [facility].  Migraines Reports decrease in frequency since starting Lamictal. No recent use of Maxalt. -Continue current management with Lamictal.  Chronic Constipation Reports bowel movements every two weeks, associated with bloating and discomfort. Amitiza was not effective. -Discontinue Amitiza. -Research alternative treatments compatible with Lamictal.  Gastroesophageal Reflux Disease (GERD) Daily symptoms managed with an acid blocker. -Refill prescription for acid blocker.  Lower Extremity Edema Reports swelling in feet and ankles, not related to activity level. Noted to be a side effect of Lamictal. -Continue monitoring.  General  Health Maintenance / Followup Plans -Continue high fiber diet and hydration for constipation management. -Check metabolic panel to  monitor for any potential side effects of Lamictal. -Follow up after consultation with nutritionist and initiation of new constipation management plan.     Patient relates that she needs counseling for mental health issues Dr. Adrian Blackwater is a psychiatrist who is prescribing her medications but not doing the counseling.

## 2022-12-20 NOTE — Patient Instructions (Addendum)
Goals  Eat 1 piece of toast with strawberries or PB sandwich for breakfast with water 16 oz Take left overs or tuna sandwich for lunch and drink 1 glass of water or bottle of water. Continue to take your MVI Reach out to one of the therapist or couseling services that is covered under your insurance.

## 2022-12-29 LAB — PROGESTERONE: Progesterone: 11.1 ng/mL

## 2023-01-06 ENCOUNTER — Ambulatory Visit
Admission: RE | Admit: 2023-01-06 | Discharge: 2023-01-06 | Disposition: A | Discharge status: Home / Self Care | Payer: Managed Care, Other (non HMO) | Source: Ambulatory Visit | Attending: Nurse Practitioner | Admitting: Nurse Practitioner

## 2023-01-06 VITALS — BP 114/76 | HR 125 | Temp 98.6°F | Resp 20

## 2023-01-06 DIAGNOSIS — R052 Subacute cough: Secondary | ICD-10-CM

## 2023-01-06 MED ORDER — ALBUTEROL SULFATE HFA 108 (90 BASE) MCG/ACT IN AERS: 1.0000 | INHALATION_SPRAY | Freq: Four times a day (QID) | RESPIRATORY_TRACT | 0 refills | Status: DC | PRN

## 2023-01-06 NOTE — Discharge Instructions (Addendum)
As we discussed, you most likely have a cough from a lingering viral infection.  Sometimes, cough can last for up to 6 weeks after a viral infection.    Since you have a history of asthma, I gave you a prescription for albuterol inhaler.  If you develop wheezing or chest tightness, this would be a good time to use it.  You can use it up to every 4-6 hours as needed for wheezing or shortness of breath.  Some things that can make you feel better are: - Increased rest - Increasing fluid with water/sugar free electrolytes - Acetaminophen as needed for fever/pain - Salt water gargling, chloraseptic spray and throat lozenges - OTC guaifenesin (Mucinex) 600 mg twice daily for congestion - Saline sinus flushes or a neti pot - Humidifying the air  OTC Medications safe in pregnancy: - acetaminophen - cepacol throat lozenges - Coricidin HBP: chest congestion/cough, cold/flu - alcohol free cough drops - dextromethorphan - guaifenesin - Neti pot/saline nasal rinses - Vicks vapo rub  For nausea, start vitamin B6 10 mg 3 times daily and doxylamine 12.5 to 25 mg at nighttime.
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
## 2023-01-06 NOTE — ED Triage Notes (Signed)
Pt reports she has had a cough x 3 weeks. N/v x 1 day    Pt reports she is currently [redacted] week pregnant.

## 2023-01-07 ENCOUNTER — Ambulatory Visit: Payer: Managed Care, Other (non HMO) | Admitting: *Deleted

## 2023-01-07 VITALS — BP 119/81 | HR 111 | Ht 65.0 in | Wt 206.0 lb

## 2023-01-07 DIAGNOSIS — N926 Irregular menstruation, unspecified: Secondary | ICD-10-CM | POA: Diagnosis not present

## 2023-01-07 DIAGNOSIS — Z3201 Encounter for pregnancy test, result positive: Secondary | ICD-10-CM

## 2023-01-07 LAB — POCT URINE PREGNANCY: Preg Test, Ur: POSITIVE — AB

## 2023-01-07 NOTE — Progress Notes (Signed)
   NURSE VISIT- PREGNANCY CONFIRMATION   SUBJECTIVE:  Toni Moss is a 22 y.o. G1P0000 female at [redacted]w[redacted]d by certain LMP of Patient's last menstrual period was 12/07/2022. Here for pregnancy confirmation.  Home pregnancy test: positive x 6   She reports cramping.  She is taking prenatal vitamins.    OBJECTIVE:  BP 119/81 (BP Location: Left Arm, Patient Position: Sitting, Cuff Size: Normal)   Pulse (!) 111   Ht 5\' 5"  (1.651 m)   Wt 206 lb (93.4 kg)   LMP 12/07/2022   BMI 34.28 kg/m   Appears well, in no apparent distress  Results for orders placed or performed in visit on 01/07/23 (from the past 24 hours)  POCT urine pregnancy   Collection Time: 01/07/23 10:24 AM  Result Value Ref Range   Preg Test, Ur Positive (A) Negative    ASSESSMENT: Positive pregnancy test, [redacted]w[redacted]d by LMP    PLAN: Schedule for dating ultrasound in 3 weeks Prenatal vitamins: continue   Nausea medicines: not currently needed   OB packet given: Yes  Jobe Marker  01/07/2023 10:25 AM

## 2023-01-18 ENCOUNTER — Other Ambulatory Visit: Payer: Self-pay | Admitting: Obstetrics & Gynecology

## 2023-01-18 DIAGNOSIS — O3680X0 Pregnancy with inconclusive fetal viability, not applicable or unspecified: Secondary | ICD-10-CM

## 2023-01-19 ENCOUNTER — Ambulatory Visit
Admission: RE | Admit: 2023-01-19 | Discharge: 2023-01-19 | Disposition: A | Discharge status: Home / Self Care | Payer: Managed Care, Other (non HMO) | Source: Ambulatory Visit | Attending: Family Medicine | Admitting: Family Medicine

## 2023-01-19 VITALS — BP 123/83 | HR 122 | Temp 98.6°F | Resp 18

## 2023-01-19 DIAGNOSIS — J22 Unspecified acute lower respiratory infection: Secondary | ICD-10-CM | POA: Diagnosis not present

## 2023-01-19 DIAGNOSIS — Z3491 Encounter for supervision of normal pregnancy, unspecified, first trimester: Secondary | ICD-10-CM | POA: Diagnosis not present

## 2023-01-19 DIAGNOSIS — R112 Nausea with vomiting, unspecified: Secondary | ICD-10-CM | POA: Diagnosis not present

## 2023-01-19 MED ORDER — DOXYLAMINE-PYRIDOXINE 10-10 MG PO TBEC: 2.0000 | DELAYED_RELEASE_TABLET | Freq: Every evening | ORAL | 0 refills | Status: DC | PRN

## 2023-01-19 MED ORDER — AZITHROMYCIN 250 MG PO TABS: ORAL_TABLET | ORAL | 0 refills | Status: DC

## 2023-01-19 NOTE — Discharge Instructions (Signed)
 You may take Tylenol, Flonase nasal spray, antihistamine such as Zyrtec or Claritin, Delsym cough syrup, plain Mucinex, saline sinus rinses, humidifiers to help with your symptoms in addition to the prescribed medications.

## 2023-01-19 NOTE — L&D Delivery Note (Signed)
 [facility] Delivery Note  Toni Moss is a 23 y.o. G1P0000 s/p SVD at [redacted]w[redacted]d. She was admitted for ROM.   ROM: 16h 49m with clear fluid GBS Status: Positive/-- (08/04 1637)   Maximum Maternal Temperature: 100.0   Labor Progress: Initial SVE: 2/30/-3. She then progressed to complete.   Delivery Date/Time: 09/02/2023 at 0252 Delivery: Called to room and patient was complete and pushing. Head delivered MOA. No nuchal cord present. Shoulder and body delivered in usual fashion. Infant with spontaneous cry, placed on mother's abdomen, dried and stimulated. Cord clamped x 2 after 1-minute delay, and cut by FOB. Cord blood drawn. Placenta delivered spontaneously with gentle cord traction. Fundus firm with massage and Pitocin . Labia, perineum, vagina, and cervix inspected inspected with 2nd degree perineal laceration and 1st degree right labial laceration noted.  Baby Weight: pending  Placenta: Sent to L&D Complications: None Lacerations: 2nd degree perineal and 1st degree labial repaired in the usual fashion.  EBL: 100 mL Analgesia: Epidural   Infant:  APGAR (1 MIN): 7  APGAR (5 MINS): 8  APGAR (10 MINS): 9

## 2023-01-19 NOTE — ED Triage Notes (Signed)
 Fever, productive cough, nausea and congestion since 12/25.  States she is [redacted] weeks pregnant .  States she feels sharp pains in her chest when she takes a deep breath.
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
## 2023-01-21 ENCOUNTER — Encounter (HOSPITAL_COMMUNITY): Payer: Self-pay

## 2023-01-21 ENCOUNTER — Emergency Department (HOSPITAL_COMMUNITY)
Admission: EM | Admit: 2023-01-21 | Discharge: 2023-01-21 | Disposition: A | Discharge status: Home / Self Care | Payer: Managed Care, Other (non HMO) | Attending: Emergency Medicine | Admitting: Emergency Medicine

## 2023-01-21 ENCOUNTER — Ambulatory Visit: Payer: Managed Care, Other (non HMO) | Admitting: Nurse Practitioner

## 2023-01-21 ENCOUNTER — Other Ambulatory Visit: Payer: Self-pay

## 2023-01-21 DIAGNOSIS — O219 Vomiting of pregnancy, unspecified: Secondary | ICD-10-CM | POA: Diagnosis present

## 2023-01-21 DIAGNOSIS — Z3A01 Less than 8 weeks gestation of pregnancy: Secondary | ICD-10-CM | POA: Insufficient documentation

## 2023-01-21 DIAGNOSIS — H6692 Otitis media, unspecified, left ear: Secondary | ICD-10-CM

## 2023-01-21 DIAGNOSIS — O210 Mild hyperemesis gravidarum: Secondary | ICD-10-CM

## 2023-01-21 LAB — URINALYSIS, ROUTINE W REFLEX MICROSCOPIC
Bilirubin Urine: NEGATIVE
Glucose, UA: NEGATIVE mg/dL
Ketones, ur: 20 mg/dL — AB
Leukocytes,Ua: NEGATIVE
Nitrite: NEGATIVE
Protein, ur: NEGATIVE mg/dL
Specific Gravity, Urine: 1.015 (ref 1.005–1.030)
pH: 6 (ref 5.0–8.0)

## 2023-01-21 LAB — CBC WITH DIFFERENTIAL/PLATELET
Abs Immature Granulocytes: 0.02 10*3/uL (ref 0.00–0.07)
Basophils Absolute: 0 10*3/uL (ref 0.0–0.1)
Basophils Relative: 0 %
Eosinophils Absolute: 0.1 10*3/uL (ref 0.0–0.5)
Eosinophils Relative: 1 %
HCT: 41.8 % (ref 36.0–46.0)
Hemoglobin: 13.8 g/dL (ref 12.0–15.0)
Immature Granulocytes: 0 %
Lymphocytes Relative: 31 %
Lymphs Abs: 2 10*3/uL (ref 0.7–4.0)
MCH: 29.3 pg (ref 26.0–34.0)
MCHC: 33 g/dL (ref 30.0–36.0)
MCV: 88.7 fL (ref 80.0–100.0)
Monocytes Absolute: 0.4 10*3/uL (ref 0.1–1.0)
Monocytes Relative: 6 %
Neutro Abs: 4 10*3/uL (ref 1.7–7.7)
Neutrophils Relative %: 62 %
Platelets: 226 10*3/uL (ref 150–400)
RBC: 4.71 MIL/uL (ref 3.87–5.11)
RDW: 12.5 % (ref 11.5–15.5)
WBC: 6.5 10*3/uL (ref 4.0–10.5)
nRBC: 0 % (ref 0.0–0.2)

## 2023-01-21 LAB — COMPREHENSIVE METABOLIC PANEL
ALT: 27 U/L (ref 0–44)
AST: 26 U/L (ref 15–41)
Albumin: 4 g/dL (ref 3.5–5.0)
Alkaline Phosphatase: 55 U/L (ref 38–126)
Anion gap: 11 (ref 5–15)
BUN: 8 mg/dL (ref 6–20)
CO2: 21 mmol/L — ABNORMAL LOW (ref 22–32)
Calcium: 9.1 mg/dL (ref 8.9–10.3)
Chloride: 105 mmol/L (ref 98–111)
Creatinine, Ser: 0.56 mg/dL (ref 0.44–1.00)
GFR, Estimated: >60 mL/min (ref >60)
Glucose, Bld: 112 mg/dL — ABNORMAL HIGH (ref 70–99)
Potassium: 3.4 mmol/L — ABNORMAL LOW (ref 3.5–5.1)
Sodium: 137 mmol/L (ref 135–145)
Total Bilirubin: 0.6 mg/dL (ref 0.0–1.2)
Total Protein: 7.4 g/dL (ref 6.5–8.1)

## 2023-01-21 LAB — LIPASE, BLOOD: Lipase: 31 U/L (ref 11–51)

## 2023-01-21 MED ORDER — DIPHENHYDRAMINE HCL 50 MG/ML IJ SOLN
25.0000 mg | Freq: Once | INTRAMUSCULAR | Status: AC
  Administered 2023-01-21: 25 mg via INTRAVENOUS
  Filled 2023-01-21: qty 1

## 2023-01-21 MED ORDER — METOCLOPRAMIDE HCL 10 MG PO TABS: 10.0000 mg | ORAL_TABLET | Freq: Four times a day (QID) | ORAL | 0 refills | Status: DC

## 2023-01-21 MED ORDER — METOCLOPRAMIDE HCL 5 MG/ML IJ SOLN
10.0000 mg | Freq: Once | INTRAMUSCULAR | Status: AC
  Administered 2023-01-21: 10 mg via INTRAVENOUS
  Filled 2023-01-21: qty 2

## 2023-01-21 MED ORDER — CEFDINIR 300 MG PO CAPS
300.0000 mg | ORAL_CAPSULE | Freq: Two times a day (BID) | ORAL | Status: DC
  Administered 2023-01-21: 300 mg via ORAL
  Filled 2023-01-21: qty 1

## 2023-01-21 MED ORDER — SODIUM CHLORIDE 0.9 % IV BOLUS
500.0000 mL | Freq: Once | INTRAVENOUS | Status: AC
  Administered 2023-01-21: 500 mL via INTRAVENOUS

## 2023-01-21 MED ORDER — CEFDINIR 300 MG PO CAPS: 300.0000 mg | ORAL_CAPSULE | Freq: Two times a day (BID) | ORAL | 0 refills | Status: AC

## 2023-01-21 MED ORDER — ONDANSETRON HCL 4 MG/2ML IJ SOLN
4.0000 mg | Freq: Once | INTRAMUSCULAR | Status: AC
  Administered 2023-01-21: 4 mg via INTRAVENOUS
  Filled 2023-01-21: qty 2

## 2023-01-21 NOTE — ED Triage Notes (Signed)
 Pt recently diagnosed with pneumonia on Wednesday but has not been able to keep food or meds down due to vomiting "all day everyday" for the last week. Pt is [redacted] weeks pregnant. Pt also feels lightheaded and dehydrated.

## 2023-01-21 NOTE — ED Notes (Signed)
Pt has not vomited since arrival.

## 2023-01-21 NOTE — ED Provider Notes (Signed)
 [place] EMERGENCY DEPARTMENT AT [facility] Provider Note   CSN: [id number] Arrival date & time: 01/21/23  1452     History  Chief Complaint  Patient presents with   Multiple complaints     Toni Moss is a 23 y.o. female.  She is G1, P0 at [redacted] weeks pregnant presents for nausea vomiting.  Has had an ongoing cough initially prescribed azithromycin  on 01/19/2023 with no improvement was still having nausea and vomiting, was prescribed Diclegis  from urgent care but this was going to be $267 and patient cannot afford it, denies abdominal pain, no urinary symptoms, no back pain, no vaginal bleeding or leaking of fluid HPI     Home Medications Prior to Admission medications   Medication Sig Start Date End Date Taking? Authorizing Provider  albuterol  (VENTOLIN  HFA) 108 (90 Base) MCG/ACT inhaler Inhale 1-2 puffs into the lungs every 6 (six) hours as needed for wheezing or shortness of breath. Patient not taking: Reported on 01/07/2023 01/06/23   Chandra Harlene LABOR, NP  azithromycin  (ZITHROMAX ) 250 MG tablet Take first 2 tablets together, then 1 every day until finished. 01/19/23   Stuart Vernell Norris, PA-C  Clindamycin-Benzoyl Per, Refr, gel Apply 1 Application topically daily. Patient not taking: Reported on 01/07/2023 09/02/21   [provider]  Doxylamine -Pyridoxine  (DICLEGIS ) 10-10 MG TBEC Take 2 tablets by mouth at bedtime as needed. 01/19/23   Stuart Vernell Norris, PA-C  lamoTRIgine  (LAMICTAL ) 100 MG tablet Take 1 tablet (100 mg total) by mouth at bedtime. Take with 25 mg tablet nightly. 12/20/22   Barbra Jayson LABOR, MD  lamoTRIgine  (LAMICTAL ) 25 MG tablet Take 1 tablet (25 mg total) by mouth at bedtime. Take with 100 mg tablet nightly. 12/20/22   Barbra Jayson LABOR, MD  ondansetron  (ZOFRAN -ODT) 4 MG disintegrating tablet Take 4 mg by mouth every 12 (twelve) hours as needed for nausea. Patient not taking: Reported on 01/07/2023 09/04/22   [provider]   pantoprazole  (PROTONIX ) 40 MG tablet Take 1 tablet (40 mg total) by mouth daily. Prn acid reflux 12/20/22   Alphonsa Glendia LABOR, MD  Prenatal Vit-Fe Fumarate-FA (PRENATAL VITAMIN PO) Take by mouth.    [provider]  rizatriptan (MAXALT-MLT) 5 MG disintegrating tablet Take 5 mg by mouth 2 (two) times daily. Patient not taking: Reported on 01/07/2023 11/04/22   [provider]  silver  sulfADIAZINE  (SILVADENE ) 1 % cream Apply 1 Application topically 2 (two) times daily. 06/11/22   Signa Delon LABOR, NP  sucralfate  (CARAFATE ) 1 GM/10ML suspension Take 10 mLs (1 g total) by mouth 4 (four) times daily -  with meals and at bedtime. Patient not taking: Reported on 01/07/2023 03/29/18 09/17/19  Mario Million, MD      Allergies    Doxycycline , Penicillins, and Prozac  [fluoxetine  hcl]    Review of Systems   Review of Systems  Physical Exam Updated Vital Signs BP 119/81   Pulse 99   Temp 97.6 F (36.4 C) (Oral)   Ht 5' 5 (1.651 m)   Wt 87.5 kg   LMP 12/07/2022   SpO2 100%   BMI 32.12 kg/m  Physical Exam Vitals and nursing note reviewed.  Constitutional:      General: She is not in acute distress.    Appearance: She is well-developed.  HENT:     Head: Normocephalic and atraumatic.     Right Ear: Tympanic membrane normal.     Left Ear: Ear canal and external ear normal. No mastoid tenderness. Tympanic  membrane is erythematous and retracted.     Mouth/Throat:     Mouth: Mucous membranes are moist.  Eyes:     Conjunctiva/sclera: Conjunctivae normal.  Cardiovascular:     Rate and Rhythm: Normal rate and regular rhythm.     Heart sounds: No murmur heard. Pulmonary:     Effort: Pulmonary effort is normal. No respiratory distress.     Breath sounds: Normal breath sounds.  Abdominal:     General: There is no distension.     Palpations: Abdomen is soft.     Tenderness: There is no abdominal tenderness.  Musculoskeletal:        General: No swelling.     Cervical back:  Neck supple.  Skin:    General: Skin is warm and dry.     Capillary Refill: Capillary refill takes less than 2 seconds.  Neurological:     General: No focal deficit present.     Mental Status: She is alert and oriented to person, place, and time.  Psychiatric:        Mood and Affect: Mood normal.     ED Results / Procedures / Treatments   Labs (all labs ordered are listed, but only abnormal results are displayed) Labs Reviewed  COMPREHENSIVE METABOLIC PANEL - Abnormal; Notable for the following components:      Result Value   Potassium 3.4 (*)    CO2 21 (*)    Glucose, Bld 112 (*)    All other components within normal limits  CBC WITH DIFFERENTIAL/PLATELET  LIPASE, BLOOD  URINALYSIS, ROUTINE W REFLEX MICROSCOPIC    EKG None  Radiology No results found.  Procedures Procedures    Medications Ordered in ED Medications  diphenhydrAMINE  (BENADRYL ) injection 25 mg (25 mg Intravenous Given 01/21/23 1929)  metoCLOPramide  (REGLAN ) injection 10 mg (10 mg Intravenous Given 01/21/23 1930)    ED Course/ Medical Decision Making/ A&P                                 Medical Decision Making Differential diagnosis: Pneumonia, URI, viral illness, other  ED course: Patient is a being treated for pneumonia with Zithromax , still not feeling better and having continued nausea and vomiting felt to be due to pregnancy, this had started before she ever got sick.  Patient got some fluids here, labs were reassuring, urine was significantly contaminated with only rare bacteria no red blood cells white blood cells or leukocytes.  Initially had Reglan  and Benadryl  with mild improvement but vomited again, feeling better after dose of Zofran .  We discussed risks of Zofran  use in early pregnancy to include possible cardiac operations, also discussed risk of continued nausea and vomiting at home including possible dehydration, hospitalization, inability to keep down her prenatal vitamins during her  pregnancy with interest as well.  Agreeable to dose of Zofran  in the ER, she is feeling better after this, will send her home with Reglan  and advised her to get over-the-counter vitamin B6 and doxylamine  succinate and take 1 of each twice a day for nausea vomiting and follow-up with gynecology.  Follow-up and return  Amount and/or Complexity of Data Reviewed Labs: ordered.  Risk Prescription drug management.           Final Clinical Impression(s) / ED Diagnoses Final diagnoses:  None    Rx / DC Orders ED Discharge Orders     None  Suellen Sherran LABOR, PA-C 01/22/23 1412    Franklyn Sid SAILOR, MD 01/22/23 [phone]

## 2023-01-21 NOTE — Discharge Instructions (Signed)
 You are seen in the ER today for continued cough and congestion found to have a ear infection on the left side, we are going to treat you with cefdinir  for this.  You can stop the azithromycin .  You are also having nausea and vomiting related to your pregnancy.  Your blood work and urinalysis were all reassuring.  You are given a prescription for nausea medicine.  The Diclegis  that you are prescribed previously from urgent care can be approximated by taking over-the-counter Unisom  which is doxylamine  succinate and doxepin which is vitamin B6.  You can also make sure to prevent your stomach from getting empty, eat high carbohydrate foods such as crackers, dry toast.  Follow-up with your gynecologist, come back to the ER for new or worsening symptoms.

## 2023-01-21 NOTE — ED Notes (Signed)
 Pt successfully drank fluids without emesis epsiode

## 2023-01-23 LAB — URINE CULTURE: Culture: <10000 — AB

## 2023-01-25 ENCOUNTER — Encounter: Payer: Self-pay | Admitting: Radiology

## 2023-01-25 ENCOUNTER — Ambulatory Visit: Payer: Managed Care, Other (non HMO) | Admitting: Radiology

## 2023-01-25 ENCOUNTER — Telehealth: Payer: Self-pay | Admitting: *Deleted

## 2023-01-25 DIAGNOSIS — Z3491 Encounter for supervision of normal pregnancy, unspecified, first trimester: Secondary | ICD-10-CM

## 2023-01-25 DIAGNOSIS — Z3A01 Less than 8 weeks gestation of pregnancy: Secondary | ICD-10-CM

## 2023-01-25 DIAGNOSIS — O3680X0 Pregnancy with inconclusive fetal viability, not applicable or unspecified: Secondary | ICD-10-CM

## 2023-01-25 MED ORDER — ONDANSETRON 4 MG PO TBDP: 4.0000 mg | ORAL_TABLET | Freq: Two times a day (BID) | ORAL | 1 refills | Status: DC | PRN

## 2023-01-25 NOTE — Telephone Encounter (Signed)
 Pt is early pregnant and is having nausea and vomiting. Can you send in something? Thanks! JSY

## 2023-01-25 NOTE — Telephone Encounter (Signed)
Refilled Zofran.

## 2023-01-25 NOTE — Progress Notes (Signed)
 LMP 12-07-22  =  7+0 weeks   Dating u/s  Anteverted uterus with single viable early IUP,  CRL = 9.72 mm  =  7 weeks,  FHR = 147 bpm Normal yolk sac = 4.52 mm   -  GS intact within mid upper fundus Normal ovaries bilaterally  -  Rt ov CL = 18 x 14 mm  -  neg adnexal regions - neg CDS, no FF

## 2023-01-27 ENCOUNTER — Encounter: Payer: Self-pay | Admitting: Nutrition

## 2023-01-28 ENCOUNTER — Other Ambulatory Visit: Payer: Managed Care, Other (non HMO)

## 2023-01-30 DIAGNOSIS — Z419 Encounter for procedure for purposes other than remedying health state, unspecified: Secondary | ICD-10-CM | POA: Diagnosis not present

## 2023-02-01 ENCOUNTER — Ambulatory Visit: Payer: Managed Care, Other (non HMO) | Admitting: Nutrition

## 2023-02-08 ENCOUNTER — Encounter (HOSPITAL_COMMUNITY): Payer: Self-pay | Admitting: Psychiatry

## 2023-02-08 ENCOUNTER — Telehealth (INDEPENDENT_AMBULATORY_CARE_PROVIDER_SITE_OTHER): Payer: 59 | Admitting: Psychiatry

## 2023-02-08 DIAGNOSIS — F603 Borderline personality disorder: Secondary | ICD-10-CM

## 2023-02-08 DIAGNOSIS — F411 Generalized anxiety disorder: Secondary | ICD-10-CM

## 2023-02-08 DIAGNOSIS — F330 Major depressive disorder, recurrent, mild: Secondary | ICD-10-CM

## 2023-02-08 DIAGNOSIS — F4310 Post-traumatic stress disorder, unspecified: Secondary | ICD-10-CM | POA: Diagnosis not present

## 2023-02-08 DIAGNOSIS — F332 Major depressive disorder, recurrent severe without psychotic features: Secondary | ICD-10-CM

## 2023-02-08 DIAGNOSIS — F4001 Agoraphobia with panic disorder: Secondary | ICD-10-CM

## 2023-02-08 DIAGNOSIS — Z3A09 9 weeks gestation of pregnancy: Secondary | ICD-10-CM

## 2023-02-08 DIAGNOSIS — Z331 Pregnant state, incidental: Secondary | ICD-10-CM

## 2023-02-08 DIAGNOSIS — F5021 Bulimia nervosa, mild: Secondary | ICD-10-CM

## 2023-02-08 MED ORDER — LAMOTRIGINE 25 MG PO TABS: ORAL_TABLET | ORAL | 0 refills | Status: DC

## 2023-02-08 MED ORDER — LAMOTRIGINE 100 MG PO TABS: 100.0000 mg | ORAL_TABLET | Freq: Every evening | ORAL | 1 refills | Status: DC
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
## 2023-02-08 NOTE — Patient Instructions (Addendum)
We restarted the lamotrigine at 25 mg nightly for 2 weeks.  Then you can take 50 mg nightly for 2 weeks.  After completing a 50 mg nightly dose then you can start the 100 mg nightly thereafter.  We also discussed the safety profile of lamotrigine in pregnancy and you can find more details about that conversation at this website: [email or web address]

## 2023-02-28 ENCOUNTER — Other Ambulatory Visit: Payer: Self-pay | Admitting: Adult Health

## 2023-02-28 MED ORDER — PROMETHAZINE HCL 25 MG PO TABS: 25.0000 mg | ORAL_TABLET | Freq: Four times a day (QID) | ORAL | 1 refills | Status: DC | PRN

## 2023-02-28 NOTE — Progress Notes (Signed)
Rx phenergan

## 2023-03-01 ENCOUNTER — Other Ambulatory Visit: Payer: Self-pay | Admitting: Obstetrics & Gynecology

## 2023-03-01 DIAGNOSIS — Z3682 Encounter for antenatal screening for nuchal translucency: Secondary | ICD-10-CM

## 2023-03-02 ENCOUNTER — Inpatient Hospital Stay (HOSPITAL_COMMUNITY): Payer: Managed Care, Other (non HMO)

## 2023-03-02 ENCOUNTER — Ambulatory Visit: Payer: Self-pay

## 2023-03-02 ENCOUNTER — Encounter: Payer: Managed Care, Other (non HMO) | Admitting: *Deleted

## 2023-03-02 ENCOUNTER — Ambulatory Visit: Payer: Managed Care, Other (non HMO) | Admitting: Advanced Practice Midwife

## 2023-03-02 ENCOUNTER — Encounter: Payer: Self-pay | Admitting: Advanced Practice Midwife

## 2023-03-02 ENCOUNTER — Encounter (HOSPITAL_COMMUNITY): Payer: Self-pay | Admitting: Obstetrics and Gynecology

## 2023-03-02 ENCOUNTER — Inpatient Hospital Stay (HOSPITAL_COMMUNITY)
Admission: AD | Admit: 2023-03-02 | Discharge: 2023-03-04 | DRG: 833 | Disposition: A | Discharge status: Home / Self Care | Payer: Managed Care, Other (non HMO) | Attending: Obstetrics and Gynecology | Admitting: Obstetrics and Gynecology

## 2023-03-02 VITALS — BP 111/90 | HR 162 | Wt 174.0 lb

## 2023-03-02 DIAGNOSIS — Z131 Encounter for screening for diabetes mellitus: Secondary | ICD-10-CM

## 2023-03-02 DIAGNOSIS — Z3A12 12 weeks gestation of pregnancy: Secondary | ICD-10-CM | POA: Diagnosis not present

## 2023-03-02 DIAGNOSIS — K8020 Calculus of gallbladder without cholecystitis without obstruction: Secondary | ICD-10-CM | POA: Diagnosis present

## 2023-03-02 DIAGNOSIS — Z3401 Encounter for supervision of normal first pregnancy, first trimester: Secondary | ICD-10-CM

## 2023-03-02 DIAGNOSIS — O99611 Diseases of the digestive system complicating pregnancy, first trimester: Secondary | ICD-10-CM | POA: Diagnosis present

## 2023-03-02 DIAGNOSIS — R000 Tachycardia, unspecified: Secondary | ICD-10-CM | POA: Diagnosis not present

## 2023-03-02 DIAGNOSIS — Z419 Encounter for procedure for purposes other than remedying health state, unspecified: Secondary | ICD-10-CM | POA: Diagnosis not present

## 2023-03-02 DIAGNOSIS — Z880 Allergy status to penicillin: Secondary | ICD-10-CM

## 2023-03-02 DIAGNOSIS — O211 Hyperemesis gravidarum with metabolic disturbance: Secondary | ICD-10-CM | POA: Diagnosis present

## 2023-03-02 DIAGNOSIS — R17 Unspecified jaundice: Secondary | ICD-10-CM

## 2023-03-02 DIAGNOSIS — Z3682 Encounter for antenatal screening for nuchal translucency: Secondary | ICD-10-CM

## 2023-03-02 DIAGNOSIS — O26899 Other specified pregnancy related conditions, unspecified trimester: Secondary | ICD-10-CM

## 2023-03-02 DIAGNOSIS — Z6834 Body mass index (BMI) 34.0-34.9, adult: Secondary | ICD-10-CM | POA: Diagnosis not present

## 2023-03-02 DIAGNOSIS — O219 Vomiting of pregnancy, unspecified: Secondary | ICD-10-CM | POA: Diagnosis not present

## 2023-03-02 DIAGNOSIS — E876 Hypokalemia: Secondary | ICD-10-CM

## 2023-03-02 DIAGNOSIS — R7401 Elevation of levels of liver transaminase levels: Secondary | ICD-10-CM | POA: Diagnosis not present

## 2023-03-02 DIAGNOSIS — Z3402 Encounter for supervision of normal first pregnancy, second trimester: Secondary | ICD-10-CM

## 2023-03-02 DIAGNOSIS — Z363 Encounter for antenatal screening for malformations: Secondary | ICD-10-CM

## 2023-03-02 DIAGNOSIS — R1013 Epigastric pain: Secondary | ICD-10-CM

## 2023-03-02 DIAGNOSIS — Z3400 Encounter for supervision of normal first pregnancy, unspecified trimester: Secondary | ICD-10-CM | POA: Insufficient documentation

## 2023-03-02 DIAGNOSIS — O26891 Other specified pregnancy related conditions, first trimester: Secondary | ICD-10-CM | POA: Diagnosis not present

## 2023-03-02 DIAGNOSIS — E860 Dehydration: Secondary | ICD-10-CM

## 2023-03-02 LAB — URINALYSIS, ROUTINE W REFLEX MICROSCOPIC
Glucose, UA: NEGATIVE mg/dL
Ketones, ur: 20 mg/dL — AB
Leukocytes,Ua: NEGATIVE
Nitrite: NEGATIVE
Protein, ur: 100 mg/dL — AB
Specific Gravity, Urine: 1.02 (ref 1.005–1.030)
pH: 5 (ref 5.0–8.0)

## 2023-03-02 LAB — COMPREHENSIVE METABOLIC PANEL
ALT: 191 U/L — ABNORMAL HIGH (ref 0–44)
AST: 103 U/L — ABNORMAL HIGH (ref 15–41)
Albumin: 3.8 g/dL (ref 3.5–5.0)
Alkaline Phosphatase: 103 U/L (ref 38–126)
Anion gap: 20 — ABNORMAL HIGH (ref 5–15)
BUN: 19 mg/dL (ref 6–20)
CO2: 17 mmol/L — ABNORMAL LOW (ref 22–32)
Calcium: 10 mg/dL (ref 8.9–10.3)
Chloride: 96 mmol/L — ABNORMAL LOW (ref 98–111)
Creatinine, Ser: 1.23 mg/dL — ABNORMAL HIGH (ref 0.44–1.00)
GFR, Estimated: >60 mL/min (ref >60)
Glucose, Bld: 121 mg/dL — ABNORMAL HIGH (ref 70–99)
Potassium: 2.7 mmol/L — CL (ref 3.5–5.1)
Sodium: 133 mmol/L — ABNORMAL LOW (ref 135–145)
Total Bilirubin: 3.3 mg/dL — ABNORMAL HIGH (ref 0.0–1.2)
Total Protein: 7.8 g/dL (ref 6.5–8.1)

## 2023-03-02 LAB — CBC WITH DIFFERENTIAL/PLATELET
Abs Immature Granulocytes: 0.14 10*3/uL — ABNORMAL HIGH (ref 0.00–0.07)
Basophils Absolute: 0.1 10*3/uL (ref 0.0–0.1)
Basophils Relative: 0 %
Eosinophils Absolute: 0.1 10*3/uL (ref 0.0–0.5)
Eosinophils Relative: 0 %
HCT: 44.8 % (ref 36.0–46.0)
Hemoglobin: 16.1 g/dL — ABNORMAL HIGH (ref 12.0–15.0)
Immature Granulocytes: 1 %
Lymphocytes Relative: 10 %
Lymphs Abs: 2.3 10*3/uL (ref 0.7–4.0)
MCH: 29.6 pg (ref 26.0–34.0)
MCHC: 35.9 g/dL (ref 30.0–36.0)
MCV: 82.4 fL (ref 80.0–100.0)
Monocytes Absolute: 1.4 10*3/uL — ABNORMAL HIGH (ref 0.1–1.0)
Monocytes Relative: 6 %
Neutro Abs: 18.6 10*3/uL — ABNORMAL HIGH (ref 1.7–7.7)
Neutrophils Relative %: 83 %
Platelets: 430 10*3/uL — ABNORMAL HIGH (ref 150–400)
RBC: 5.44 MIL/uL — ABNORMAL HIGH (ref 3.87–5.11)
RDW: 13.2 % (ref 11.5–15.5)
WBC: 22.5 10*3/uL — ABNORMAL HIGH (ref 4.0–10.5)
nRBC: 0 % (ref 0.0–0.2)

## 2023-03-02 LAB — TYPE AND SCREEN
ABO/RH(D): A NEG
Antibody Screen: NEGATIVE

## 2023-03-02 LAB — AMYLASE: Amylase: 94 U/L (ref 28–100)

## 2023-03-02 LAB — LACTIC ACID, PLASMA: Lactic Acid, Venous: 1.4 mmol/L (ref 0.5–1.9)

## 2023-03-02 LAB — D-DIMER, QUANTITATIVE: D-Dimer, Quant: 0.59 ug{FEU}/mL — ABNORMAL HIGH (ref 0.00–0.50)

## 2023-03-02 LAB — LIPASE, BLOOD: Lipase: 59 U/L — ABNORMAL HIGH (ref 11–51)

## 2023-03-02 LAB — MAGNESIUM: Magnesium: 2.4 mg/dL (ref 1.7–2.4)

## 2023-03-02 LAB — PROCALCITONIN: Procalcitonin: 0.22 ng/mL

## 2023-03-02 LAB — BILIRUBIN, DIRECT: Bilirubin, Direct: 1.5 mg/dL — ABNORMAL HIGH (ref 0.0–0.2)

## 2023-03-02 MED ORDER — ENOXAPARIN SODIUM 40 MG/0.4ML IJ SOSY
40.0000 mg | PREFILLED_SYRINGE | INTRAMUSCULAR | Status: DC
  Administered 2023-03-03 – 2023-03-04 (×2): 40 mg via SUBCUTANEOUS
  Filled 2023-03-02 (×2): qty 0.4

## 2023-03-02 MED ORDER — DOCUSATE SODIUM 100 MG PO CAPS
100.0000 mg | ORAL_CAPSULE | Freq: Every day | ORAL | Status: DC
  Administered 2023-03-03 – 2023-03-04 (×2): 100 mg via ORAL
  Filled 2023-03-02 (×2): qty 1

## 2023-03-02 MED ORDER — LACTATED RINGERS IV BOLUS
2000.0000 mL | Freq: Once | INTRAVENOUS | Status: AC
  Administered 2023-03-03: 2000 mL via INTRAVENOUS

## 2023-03-02 MED ORDER — KCL-LACTATED RINGERS-D5W 20 MEQ/L IV SOLN
INTRAVENOUS | Status: DC
Start: 2023-03-03 — End: 2023-03-04
  Filled 2023-03-02 (×6): qty 1000

## 2023-03-02 MED ORDER — SODIUM CHLORIDE 0.9 % IV SOLN: Freq: Once | INTRAVENOUS | Status: DC

## 2023-03-02 MED ORDER — LACTATED RINGERS IV BOLUS: 1000.0000 mL | Freq: Once | INTRAVENOUS | Status: DC

## 2023-03-02 MED ORDER — SUCRALFATE 1 GM/10ML PO SUSP
1.0000 g | Freq: Three times a day (TID) | ORAL | Status: DC
  Administered 2023-03-02: 1 g via ORAL
  Filled 2023-03-02 (×9): qty 10

## 2023-03-02 MED ORDER — SODIUM CHLORIDE 0.9 % IV SOLN
2.0000 g | Freq: Once | INTRAVENOUS | Status: AC
  Administered 2023-03-02: 2 g via INTRAVENOUS
  Filled 2023-03-02: qty 20

## 2023-03-02 MED ORDER — LACTATED RINGERS IV BOLUS (SEPSIS): 500.0000 mL | Freq: Once | INTRAVENOUS | Status: DC

## 2023-03-02 MED ORDER — SODIUM CHLORIDE 0.9 % IV SOLN: Freq: Once | INTRAVENOUS | Status: AC

## 2023-03-02 MED ORDER — METRONIDAZOLE 500 MG/100ML IV SOLN
500.0000 mg | Freq: Once | INTRAVENOUS | Status: AC
  Administered 2023-03-02: 500 mg via INTRAVENOUS
  Filled 2023-03-02: qty 100

## 2023-03-02 MED ORDER — LACTATED RINGERS IV BOLUS (SEPSIS): 1000.0000 mL | Freq: Once | INTRAVENOUS | Status: DC

## 2023-03-02 MED ORDER — PRENATAL MULTIVITAMIN CH
1.0000 | ORAL_TABLET | Freq: Every day | ORAL | Status: DC
  Filled 2023-03-02: qty 1

## 2023-03-02 MED ORDER — SODIUM CHLORIDE 0.9 % IV SOLN: 2.0000 g | INTRAVENOUS | Status: DC

## 2023-03-02 MED ORDER — CALCIUM CARBONATE ANTACID 500 MG PO CHEW
2.0000 | CHEWABLE_TABLET | ORAL | Status: DC | PRN
  Administered 2023-03-03: 400 mg via ORAL
  Filled 2023-03-02: qty 2

## 2023-03-02 MED ORDER — POTASSIUM CHLORIDE 10 MEQ/100ML IV SOLN
10.0000 meq | INTRAVENOUS | Status: AC
Start: 2023-03-03 — End: 2023-03-03
  Administered 2023-03-03 (×4): 10 meq via INTRAVENOUS
  Filled 2023-03-02 (×4): qty 100

## 2023-03-02 MED ORDER — ACETAMINOPHEN 325 MG PO TABS: 650.0000 mg | ORAL_TABLET | ORAL | Status: DC | PRN

## 2023-03-02 NOTE — MAU Provider Note (Addendum)
 History     CSN: [id number]  Arrival date and time: 03/02/23 1637   Event Date/Time   First Provider Initiated Contact with Patient 03/02/23 2023      Chief Complaint  Patient presents with   Emesis   [name] , a  23 y.o. G1P0000 at [redacted]w[redacted]d presents to MAU after being sent from the office for 1/V; dehydrated w tachycardia up into the 140's.   Patient reports that she has had nausea and vomiting for the last 3 weeks. She noted that she was taking Zofran and it was helping until about 1 week ago. She states that she tried taking Promethazine this week but it made her feel like a "zombie" and so she stopped taking that as well. She states that she has not been able to keep anything down for the last 4 days. She states that they last time she tried to keep anything down was yesterday. She also reports pain in the center of her chest that she describes as a "knot." She states that it has been there for the last 4 days reports feels better with ice. She states that she has not ran a fever, but states that 2 days ago she felt so hot that she took an ice bath. She also reports that 4 days ago she noted that her urine was starting to get dark in color. Today she states that she thinks she has blood in it denies pain with urination. She denies SOB, but noted that she feels like her heart is racing. She denies problems or complications with gallbladder, liver, appendix, or kidneys.   When asked if patient has had a recent infection she reports that less than 1 month ago she was treated for pneumonia at [facility].        Emesis  Associated symptoms include chest pain. Pertinent negatives include no abdominal pain, chills, diarrhea, fever or headaches.    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   Chlamydia 02/25/2020   Treated 02/24/20 at Urgent Care with doxy, POC___________   Depression    IBS (irritable bowel syndrome)    Panic disorder with agoraphobia    Personality disorder (HCC)    PTSD (post-traumatic stress disorder)    Tic disorder     Past Surgical History:  Procedure Laterality Date   TYMPANOSTOMY TUBE PLACEMENT      Family History  Problem Relation Age of Onset   Depression Mother    Colon polyps Mother    Tourette syndrome Sister    Colon polyps Maternal Aunt    Alcohol abuse Maternal Uncle    Depression Maternal Grandmother    Colon polyps Maternal Grandmother    Lung cancer Paternal Grandmother    Tics Cousin    Bipolar disorder Other    Colon cancer Neg Hx    Esophageal cancer Neg Hx    Rectal cancer Neg Hx  Stomach cancer Neg Hx     Social History   Tobacco Use   Smoking status: Never   Smokeless tobacco: Never  Vaping Use   Vaping status: Never Used  Substance Use Topics   Alcohol use: No   Drug use: No    Allergies:  Allergies  Allergen Reactions   Doxycycline Nausea And Vomiting   Penicillins Hives   Prozac [Fluoxetine Hcl]     Medications Prior to Admission  Medication Sig Dispense Refill Last Dose/Taking   promethazine (PHENERGAN) 25 MG tablet Take 1 tablet (25 mg total) by mouth every 6 (six) hours as needed for nausea or vomiting. (Patient not taking: Reported on 03/02/2023) 30 tablet 1    [START ON 03/09/2023] lamoTRIgine (LAMICTAL) 100 MG tablet Take 1 tablet (100 mg total) by mouth at bedtime. After completing 50 mg nightly dose. (Patient not taking: Reported on 03/02/2023) 30 tablet 1    lamoTRIgine (LAMICTAL) 25 MG tablet Take 1 tablet nightly for 2 weeks.  Then take 2 tablets nightly for 2 weeks. (Patient not taking: Reported on 03/02/2023) 42 tablet 0    ondansetron (ZOFRAN-ODT) 4 MG  disintegrating tablet Take 1 tablet (4 mg total) by mouth every 12 (twelve) hours as needed for nausea. (Patient not taking: Reported on 03/02/2023) 30 tablet 1    Prenatal Vit-Fe Fumarate-FA (PRENATAL VITAMIN PO) Take by mouth.       Review of Systems  Constitutional:  Positive for fatigue. Negative for chills and fever.  Eyes:  Negative for pain and visual disturbance.  Respiratory:  Negative for apnea, shortness of breath and wheezing.   Cardiovascular:  Positive for chest pain. Negative for palpitations.  Gastrointestinal:  Positive for nausea and vomiting. Negative for abdominal pain, constipation and diarrhea.  Genitourinary:  Positive for decreased urine volume, difficulty urinating and hematuria. Negative for dysuria, pelvic pain, vaginal bleeding, vaginal discharge and vaginal pain.  Musculoskeletal:  Negative for back pain.  Skin:  Positive for color change.  Neurological:  Positive for weakness and light-headedness. Negative for seizures and headaches.  Psychiatric/Behavioral:  Negative for suicidal ideas.    Physical Exam   Blood pressure 123/85, pulse (!) 149, temperature 97.8 F (36.6 C), resp. rate 18, height 5\' 5"  (1.651 m), weight 78.9 kg, last menstrual period 12/07/2022.  Physical Exam Vitals and nursing note reviewed.  Constitutional:      General: She is not in acute distress.    Appearance: She is ill-appearing.  HENT:     Head: Normocephalic.  Eyes:     Pupils: Pupils are equal, round, and reactive to light.     Comments: Both eyes have a yellowish tint to them.   Cardiovascular:     Rate and Rhythm: Tachycardia present.     Heart sounds: Normal heart sounds.  Pulmonary:     Effort: Pulmonary effort is normal.     Breath sounds: Examination of the right-upper field reveals decreased breath sounds. Examination of the right-middle field reveals decreased breath sounds. Examination of the right-lower field reveals decreased breath sounds. Decreased breath sounds  present.  Abdominal:     Palpations: Abdomen is soft.     Tenderness: There is no abdominal tenderness.  Genitourinary:    Comments: CNM was able to see urine, prior to sending to lab, dark tea colored urine with large quantity of sediment noted.  Musculoskeletal:     Cervical back: Normal range of motion.  Skin:    General: Skin is warm and dry.  Capillary Refill: Capillary refill takes more than 3 seconds.     Coloration: Skin is pale.  Neurological:     Mental Status: She is alert and oriented to person, place, and time.  Psychiatric:        Mood and Affect: Mood normal.     MAU Course  Procedures Orders Placed This Encounter  Procedures   Culture, blood (x 2)   US ABDOMEN LIMITED RUQ (LIVER/GB)   DG Chest Port 1 View   US Abdomen Complete   Urinalysis, Routine w reflex microscopic -Urine, Clean Catch   CBC with Differential/Platelet   Comprehensive metabolic panel   Amylase   Lipase, blood   Lactic acid, plasma   Procalcitonin   D-dimer, quantitative   Magnesium   Refer to Sidebar Report: Sepsis Bundle ED/IP   Apply Sepsis Care Plan   If lactate (lactic acid) >2, verify repeat lactic acid order has been placed to be drawn   Document vital signs within 1-hour of fluid bolus completion and notify provider of bolus completion   Vital signs   Vital signs   Assess and Document Glasgow Coma Scale   RN to call RRT (rapid response team) and provider   Cardiac Monitoring Continuous x 12 hours Indications for use: Other; other indications for use: Sinus Tach   Code Sepsis activation.  This occurs automatically when order is signed and prioritizes pharmacy, lab, and radiology services for STAT collections and interventions.  If [facility] downtime, call [facility] [phone]) to activate Code Sepsis.   ED EKG   Type and screen [facility]   Insert peripheral IV   Meds ordered this encounter  Medications   DISCONTD: lactated ringers bolus 1,000 mL    DISCONTD: lactated ringers bolus 1,000 mL    Total Body Weight basis for 30 mL/kg  bolus delivery:   78.9 kg   DISCONTD: lactated ringers bolus 1,000 mL    Total Body Weight basis for 30 mL/kg  bolus delivery:   78.9 kg   DISCONTD: lactated ringers bolus 500 mL    Total Body Weight basis for 30 mL/kg  bolus delivery:   78.9 kg   cefTRIAXone (ROCEPHIN) 2 g in sodium chloride 0.9 % 100 mL IVPB    Antibiotic Indication::   Other Indication (list below)    Other Indication::   sepsis   metroNIDAZOLE (FLAGYL) IVPB 500 mg    Antibiotic Indication::   Other Indication (list below)    Other Indication::   sepsis   sucralfate (CARAFATE) 1 GM/10ML suspension 1 g   0.9 %  sodium chloride infusion   0.9 %  sodium chloride infusion    Results for orders placed or performed during the hospital encounter of 03/02/23 (from the past 24 hours)  CBC with Differential/Platelet     Status: Abnormal   Collection Time: 03/02/23  7:36 PM  Result Value Ref Range   WBC 22.5 (H) 4.0 - 10.5 K/uL   RBC 5.44 (H) 3.87 - 5.11 MIL/uL   Hemoglobin 16.1 (H) 12.0 - 15.0 g/dL   HCT 09.8 11.9 - 14.7 %   MCV 82.4 80.0 - 100.0 fL   MCH 29.6 26.0 - 34.0 pg   MCHC 35.9 30.0 - 36.0 g/dL   RDW 82.9 56.2 - 13.0 %   Platelets 430 (H) 150 - 400 K/uL   nRBC 0.0 0.0 - 0.2 %   Neutrophils Relative % 83 %   Neutro Abs 18.6 (H) 1.7 - 7.7 K/uL  Lymphocytes Relative 10 %   Lymphs Abs 2.3 0.7 - 4.0 K/uL   Monocytes Relative 6 %   Monocytes Absolute 1.4 (H) 0.1 - 1.0 K/uL   Eosinophils Relative 0 %   Eosinophils Absolute 0.1 0.0 - 0.5 K/uL   Basophils Relative 0 %   Basophils Absolute 0.1 0.0 - 0.1 K/uL   Immature Granulocytes 1 %   Abs Immature Granulocytes 0.14 (H) 0.00 - 0.07 K/uL  Comprehensive metabolic panel     Status: Abnormal   Collection Time: 03/02/23  7:36 PM  Result Value Ref Range   Sodium 133 (L) 135 - 145 mmol/L   Potassium 2.7 (LL) 3.5 - 5.1 mmol/L   Chloride 96 (L) 98 - 111 mmol/L   CO2 17 (L) 22 - 32  mmol/L   Glucose, Bld 121 (H) 70 - 99 mg/dL   BUN 19 6 - 20 mg/dL   Creatinine, Ser 0.98 (H) 0.44 - 1.00 mg/dL   Calcium 11.9 8.9 - 14.7 mg/dL   Total Protein 7.8 6.5 - 8.1 g/dL   Albumin 3.8 3.5 - 5.0 g/dL   AST 829 (H) 15 - 41 U/L   ALT 191 (H) 0 - 44 U/L   Alkaline Phosphatase 103 38 - 126 U/L   Total Bilirubin 3.3 (H) 0.0 - 1.2 mg/dL   GFR, Estimated >56 >21 mL/min   Anion gap 20 (H) 5 - 15    MDM - Sepsis protocol initiated  - Chest X-ray ordered.  - Transition of care to Wynelle Bourgeois, CNM @ [address] ([address]) Suzie Portela, MSN, CNM  Center for [facility]  03/02/2023 9:25 PM    Assumed care prior to admission to [facility] Unit Vitals:   03/02/23 1844 03/02/23 2042 03/02/23 2207 03/02/23 2318  BP: 123/85 (!) 122/98 105/76 111/81   03/02/23 2333  BP: 117/74   Heart rate came down to 120s after hydration.  Results for orders placed or performed during the hospital encounter of 03/02/23 (from the past 24 hours)  Urinalysis, Routine w reflex microscopic -Urine, Clean Catch     Status: Abnormal   Collection Time: 03/02/23  6:43 PM  Result Value Ref Range   Color, Urine AMBER (A) YELLOW   APPearance TURBID (A) CLEAR   Specific Gravity, Urine 1.020 1.005 - 1.030   pH 5.0 5.0 - 8.0   Glucose, UA NEGATIVE NEGATIVE mg/dL   Hgb urine dipstick SMALL (A) NEGATIVE   Bilirubin Urine MODERATE (A) NEGATIVE   Ketones, ur 20 (A) NEGATIVE mg/dL   Protein, ur 308 (A) NEGATIVE mg/dL   Nitrite NEGATIVE NEGATIVE   Leukocytes,Ua NEGATIVE NEGATIVE   RBC / HPF 11-20 0 - 5 RBC/hpf   WBC, UA 6-10 0 - 5 WBC/hpf   Bacteria, UA MANY (A) NONE SEEN   Squamous Epithelial / HPF 21-50 0 - 5 /HPF   Mucus PRESENT    Hyaline Casts, UA PRESENT    Ca Oxalate Crys, UA PRESENT    Crystals AMMONIUM BIURATE CRYSTALS PRESENT (A) NEGATIVE  CBC with Differential/Platelet     Status: Abnormal   Collection Time: 03/02/23  7:36 PM  Result Value Ref Range   WBC 22.5 (H) 4.0 - 10.5 K/uL   RBC  5.44 (H) 3.87 - 5.11 MIL/uL   Hemoglobin 16.1 (H) 12.0 - 15.0 g/dL   HCT 65.7 84.6 - 96.2 %   MCV 82.4 80.0 - 100.0 fL   MCH 29.6 26.0 - 34.0 pg   MCHC 35.9 30.0 - 36.0  g/dL   RDW 74.2 59.5 - 63.8 %   Platelets 430 (H) 150 - 400 K/uL   nRBC 0.0 0.0 - 0.2 %   Neutrophils Relative % 83 %   Neutro Abs 18.6 (H) 1.7 - 7.7 K/uL   Lymphocytes Relative 10 %   Lymphs Abs 2.3 0.7 - 4.0 K/uL   Monocytes Relative 6 %   Monocytes Absolute 1.4 (H) 0.1 - 1.0 K/uL   Eosinophils Relative 0 %   Eosinophils Absolute 0.1 0.0 - 0.5 K/uL   Basophils Relative 0 %   Basophils Absolute 0.1 0.0 - 0.1 K/uL   Immature Granulocytes 1 %   Abs Immature Granulocytes 0.14 (H) 0.00 - 0.07 K/uL  Comprehensive metabolic panel     Status: Abnormal   Collection Time: 03/02/23  7:36 PM  Result Value Ref Range   Sodium 133 (L) 135 - 145 mmol/L   Potassium 2.7 (LL) 3.5 - 5.1 mmol/L   Chloride 96 (L) 98 - 111 mmol/L   CO2 17 (L) 22 - 32 mmol/L   Glucose, Bld 121 (H) 70 - 99 mg/dL   BUN 19 6 - 20 mg/dL   Creatinine, Ser 7.56 (H) 0.44 - 1.00 mg/dL   Calcium 43.3 8.9 - 29.5 mg/dL   Total Protein 7.8 6.5 - 8.1 g/dL   Albumin 3.8 3.5 - 5.0 g/dL   AST 188 (H) 15 - 41 U/L   ALT 191 (H) 0 - 44 U/L   Alkaline Phosphatase 103 38 - 126 U/L   Total Bilirubin 3.3 (H) 0.0 - 1.2 mg/dL   GFR, Estimated >41 >66 mL/min   Anion gap 20 (H) 5 - 15  Amylase     Status: None   Collection Time: 03/02/23  9:53 PM  Result Value Ref Range   Amylase 94 28 - 100 U/L  Lipase, blood     Status: Abnormal   Collection Time: 03/02/23  9:53 PM  Result Value Ref Range   Lipase 59 (H) 11 - 51 U/L  Lactic acid, plasma     Status: None   Collection Time: 03/02/23  9:53 PM  Result Value Ref Range   Lactic Acid, Venous 1.4 0.5 - 1.9 mmol/L  Procalcitonin     Status: None   Collection Time: 03/02/23  9:53 PM  Result Value Ref Range   Procalcitonin 0.22 ng/mL  Type and screen [facility]     Status: None   Collection Time:  03/02/23  9:53 PM  Result Value Ref Range   ABO/RH(D) A NEG    Antibody Screen NEG    Sample Expiration      03/05/2023,2359 Performed at [facility], [address] [place], [place] [address]   D-dimer, quantitative     Status: Abnormal   Collection Time: 03/02/23  9:53 PM  Result Value Ref Range   D-Dimer, Quant 0.59 (H) 0.00 - 0.50 ug/mL-FEU  Magnesium     Status: None   Collection Time: 03/02/23  9:53 PM  Result Value Ref Range   Magnesium 2.4 1.7 - 2.4 mg/dL  Bilirubin, direct     Status: Abnormal   Collection Time: 03/02/23  9:53 PM  Result Value Ref Range   Bilirubin, Direct 1.5 (H) 0.0 - 0.2 mg/dL  Hepatitis panel, acute     Status: None   Collection Time: 03/03/23 12:23 AM  Result Value Ref Range   Hepatitis B Surface Ag NON REACTIVE NON REACTIVE   HCV Ab NON REACTIVE  NON REACTIVE   Hep A IgM NON REACTIVE NON REACTIVE   Hep B C IgM NON REACTIVE NON REACTIVE   US Abdomen Complete Result Date: 03/02/2023 CLINICAL DATA:  Vomiting.  Twelve weeks pregnant. EXAM: ABDOMEN ULTRASOUND COMPLETE COMPARISON:  None Available. FINDINGS: Gallbladder: Sludge and innumerable small stones. Positive sonographic Murphy's sign noted by the sonographer. No gallbladder wall thickening or pericholecystic fluid. Common bile duct: Diameter: 1 mm. Liver: No focal lesion identified. Within normal limits in parenchymal echogenicity. Portal vein is patent on color Doppler imaging with normal direction of blood flow towards the liver. IVC: No abnormality visualized. Pancreas: Limited visualization. Spleen: Size and appearance within normal limits.  8.2 cm in length. Right Kidney: Length: 11.6 cm. Echogenicity within normal limits. No mass or hydronephrosis visualized. Left Kidney: Length: 11.9 cm. Echogenicity within normal limits. No mass or hydronephrosis visualized. Abdominal aorta: No aneurysm visualized. Other findings: None. IMPRESSION: Findings are equivocal for acute cholecystitis. Extensive  sludge and small stones within the gallbladder with positive Murphy's sign noted by the sonographer. No pericholecystic fluid or gallbladder wall thickening. Electronically Signed   By: Minerva Fester M.D.   On: 03/02/2023 23:11   DG Chest Port 1 View Result Date: 03/02/2023 CLINICAL DATA:  Vomiting for 3 weeks. Dehydration. High heart rate. Epigastric pain and chest pain EXAM: PORTABLE CHEST 1 VIEW COMPARISON:  03/29/2018 FINDINGS: The heart size and mediastinal contours are within normal limits. Both lungs are clear. The visualized skeletal structures are unremarkable. IMPRESSION: No active disease. Electronically Signed   By: Minerva Fester M.D.   On: 03/02/2023 21:08   US Fetal Nuchal Translucency Measurement Result Date: 03/02/2023 Table formatting from the original result was not included. Images from the original result were not included.  ..an Financial trader of Ultrasound Medicine Technical sales engineer) and nuchal translucency accredited practice [facility] [address] [place] [address]         Ordering Provider: Myna Hidalgo, DO                                                                                                                                                                                               NUCHAL TRANSLUCENCY FOR INTEGRATED SCREEN FELICIE KOCHER is in the office for nuchal translucency sonogram as part of an integrated screen. She is a 23 y.o. year old G1P0000 with Estimated Date of Delivery: 09/13/23 by LMP now at  [redacted]w[redacted]d weeks gestation. Thus far the pregnancy has been complicated by BMI 30-39 . GESTATION: SINGLETON FETAL ACTIVITY:          Heart  rate                  The fetus is active. AMNIOTIC FLUID: The amniotic fluid volume is  normal visually PLACENTA LOCALIZATION:  anterior GRADE 0 CERVIX: Measures appears closed ADNEXA: The ovaries are normal. GESTATIONAL AGE AND  BIOMETRICS: Gestational criteria: Estimated Date of Delivery: 09/13/23  by LMP now at [redacted]w[redacted]d Previous Scans:1                      CROWN RUMP LENGTH           63.92 mm         12+6 weeks NUCHAL TRANSLUCENCY           1.7 mm         normal                                                                   AVERAGE EGA(BY THIS SCAN):  12+6 weeks The fetal nasal bone is identified.  TECHNICIAN COMMENTS: [place] 12+1 wks,measurements c/w dates,FHR 165 bpm,anterior placenta,normal ovaries,NB  present,NT 1.7 mm,CRL 63.92 mm The patient will have the first blood draw of her integrated screening today and the second draw in approximately 4 weeks. Amber Flora Lipps 03/02/2023 2:05 PM Clinical Impression and recommendations: I have reviewed the sonogram results above, combined with the patient's current clinical course, below are my impressions and any appropriate recommendations for management based on the sonographic findings. 1.  G1P0000  Estimated Date of Delivery: Estimated Date of Delivery: 09/13/23 by  LMP and confirmed by today's sonographic dating 2.  Normal fetal sonographic findings, specifically normal nuchal translucency and present fetal nasal bone Additionally the cranium, both choroid plexuses, zygomatic arch, mandible, 4 equal extremities, stomach, bladder and anterior abdomen are all noted. 3.  Normal general sonographic findings Recommend routine prenatal care based on this sonogram or as clinically indicated Sharon Seller                                                   Dr Despina Hidden evaluated findings and results He recommends admission and further evaluation   Assessment and Plan  A:   Single IUP at [redacted]w[redacted]d        Tachycardia         Leukocytosis         Cholelithiasis and Cholecystitis        Dehydration         Elevated Creatinine         Hypokalemia  P:  Continue sepsis protocol       Admit to [facility]       Plan for further evaluation of gallbladder with MR MRCP       MD to follow  Aviva Signs, CNM

## 2023-03-02 NOTE — Patient Instructions (Signed)
Toni Moss, thank you for choosing our office today! We appreciate the opportunity to meet your healthcare needs. You may receive a short survey by mail, e-mail, or through [employer]. If you are happy with your care we would appreciate if you could take just a few minutes to complete the survey questions. We read all of your comments and take your feedback very seriously. Thank you again for choosing our office.  [facility] [employer] [facility] at [facility]  [facility] at [facility] ([address] [place], [place] [address]) Entrance C, located off of [address] Free 24/7 valet parking   Nausea & Vomiting Have saltine crackers or pretzels by your bed and eat a few bites before you raise your head out of bed in the morning Eat small frequent meals throughout the day instead of large meals Drink plenty of fluids throughout the day to stay hydrated, just don't drink a lot of fluids with your meals.  This can make your stomach fill up faster making you feel sick Do not brush your teeth right after you eat Products with real ginger are good for nausea, like ginger ale and ginger hard candy Make sure it says made with real ginger! Sucking on sour candy like lemon heads is also good for nausea If your prenatal vitamins make you nauseated, take them at night so you will sleep through the nausea Sea Bands If you feel like you need medicine for the nausea & vomiting please let us know If you are unable to keep any fluids or food down please let us know   Constipation Drink plenty of fluid, preferably water, throughout the day Eat foods high in fiber such as fruits, vegetables, and grains Exercise, such as walking, is a good way to keep your bowels regular Drink warm fluids, especially warm prune juice, or decaf coffee Eat a 1/2 cup of real oatmeal (not instant), 1/2 cup applesauce, and 1/2-1 cup warm prune juice every day If needed, you may take Colace (docusate sodium) stool softener  once or twice a day to help keep the stool soft.  If you still are having problems with constipation, you may take Miralax once daily as needed to help keep your bowels regular.   Home Blood Pressure Monitoring for Patients   Your provider has recommended that you check your blood pressure (BP) at least once a week at home. If you do not have a blood pressure cuff at home, one will be provided for you. Contact your provider if you have not received your monitor within 1 week.   Helpful Tips for Accurate Home Blood Pressure Checks  Don't smoke, exercise, or drink caffeine 30 minutes before checking your BP Use the restroom before checking your BP (a full bladder can raise your pressure) Relax in a comfortable upright chair Feet on the ground Left arm resting comfortably on a flat surface at the level of your heart Legs uncrossed Back supported Sit quietly and don't talk Place the cuff on your bare arm Adjust snuggly, so that only two fingertips can fit between your skin and the top of the cuff Check 2 readings separated by at least one minute Keep a log of your BP readings For a visual, please reference this diagram: http://ccnc.care/bpdiagram  Provider Name: [facility]     Phone: [phone]  Zone 1: ALL CLEAR  Continue to monitor your symptoms:  BP reading is less than 140 (top number) or less than 90 (bottom  number)  No right upper stomach pain No headaches or seeing spots No feeling nauseated or throwing up No swelling in face and hands  Zone 2: CAUTION Call your doctor's office for any of the following:  BP reading is greater than 140 (top number) or greater than 90 (bottom number)  Stomach pain under your ribs in the middle or right side Headaches or seeing spots Feeling nauseated or throwing up Swelling in face and hands  Zone 3: EMERGENCY  Seek immediate medical care if you have any of the following:  BP reading is greater than160 (top number) or greater than  110 (bottom number) Severe headaches not improving with Tylenol Serious difficulty catching your breath Any worsening symptoms from Zone 2    First Trimester of Pregnancy The first trimester of pregnancy is from week 1 until the end of week 12 (months 1 through 3). A week after a sperm fertilizes an egg, the egg will implant on the wall of the uterus. This embryo will begin to develop into a baby. Genes from you and your partner are forming the baby. The female genes determine whether the baby is a boy or a girl. At 6-8 weeks, the eyes and face are formed, and the heartbeat can be seen on ultrasound. At the end of 12 weeks, all the baby's organs are formed.  Now that you are pregnant, you will want to do everything you can to have a healthy baby. Two of the most important things are to get good prenatal care and to follow your health care provider's instructions. Prenatal care is all the medical care you receive before the baby's birth. This care will help prevent, find, and treat any problems during the pregnancy and childbirth. BODY CHANGES Your body goes through many changes during pregnancy. The changes vary from woman to woman.  You may gain or lose a couple of pounds at first. You may feel sick to your stomach (nauseous) and throw up (vomit). If the vomiting is uncontrollable, call your health care provider. You may tire easily. You may develop headaches that can be relieved by medicines approved by your health care provider. You may urinate more often. Painful urination may mean you have a bladder infection. You may develop heartburn as a result of your pregnancy. You may develop constipation because certain hormones are causing the muscles that push waste through your intestines to slow down. You may develop hemorrhoids or swollen, bulging veins (varicose veins). Your breasts may begin to grow larger and become tender. Your nipples may stick out more, and the tissue that surrounds them  (areola) may become darker. Your gums may bleed and may be sensitive to brushing and flossing. Dark spots or blotches (chloasma, mask of pregnancy) may develop on your face. This will likely fade after the baby is born. Your menstrual periods will stop. You may have a loss of appetite. You may develop cravings for certain kinds of food. You may have changes in your emotions from day to day, such as being excited to be pregnant or being concerned that something may go wrong with the pregnancy and baby. You may have more vivid and strange dreams. You may have changes in your hair. These can include thickening of your hair, rapid growth, and changes in texture. Some women also have hair loss during or after pregnancy, or hair that feels dry or thin. Your hair will most likely return to normal after your baby is born. WHAT TO EXPECT AT YOUR PRENATAL  VISITS During a routine prenatal visit: You will be weighed to make sure you and the baby are growing normally. Your blood pressure will be taken. Your abdomen will be measured to track your baby's growth. The fetal heartbeat will be listened to starting around week 10 or 12 of your pregnancy. Test results from any previous visits will be discussed. Your health care provider may ask you: How you are feeling. If you are feeling the baby move. If you have had any abnormal symptoms, such as leaking fluid, bleeding, severe headaches, or abdominal cramping. If you have any questions. Other tests that may be performed during your first trimester include: Blood tests to find your blood type and to check for the presence of any previous infections. They will also be used to check for low iron levels (anemia) and Rh antibodies. Later in the pregnancy, blood tests for diabetes will be done along with other tests if problems develop. Urine tests to check for infections, diabetes, or protein in the urine. An ultrasound to confirm the proper growth and development  of the baby. An amniocentesis to check for possible genetic problems. Fetal screens for spina bifida and Down syndrome. You may need other tests to make sure you and the baby are doing well. HOME CARE INSTRUCTIONS  Medicines Follow your health care provider's instructions regarding medicine use. Specific medicines may be either safe or unsafe to take during pregnancy. Take your prenatal vitamins as directed. If you develop constipation, try taking a stool softener if your health care provider approves. Diet Eat regular, well-balanced meals. Choose a variety of foods, such as meat or vegetable-based protein, fish, milk and low-fat dairy products, vegetables, fruits, and whole grain breads and cereals. Your health care provider will help you determine the amount of weight gain that is right for you. Avoid raw meat and uncooked cheese. These carry germs that can cause birth defects in the baby. Eating four or five small meals rather than three large meals a day may help relieve nausea and vomiting. If you start to feel nauseous, eating a few soda crackers can be helpful. Drinking liquids between meals instead of during meals also seems to help nausea and vomiting. If you develop constipation, eat more high-fiber foods, such as fresh vegetables or fruit and whole grains. Drink enough fluids to keep your urine clear or pale yellow. Activity and Exercise Exercise only as directed by your health care provider. Exercising will help you: Control your weight. Stay in shape. Be prepared for labor and delivery. Experiencing pain or cramping in the lower abdomen or low back is a good sign that you should stop exercising. Check with your health care provider before continuing normal exercises. Try to avoid standing for long periods of time. Move your legs often if you must stand in one place for a long time. Avoid heavy lifting. Wear low-heeled shoes, and practice good posture. You may continue to have sex  unless your health care provider directs you otherwise. Relief of Pain or Discomfort Wear a good support bra for breast tenderness.   Take warm sitz baths to soothe any pain or discomfort caused by hemorrhoids. Use hemorrhoid cream if your health care provider approves.   Rest with your legs elevated if you have leg cramps or low back pain. If you develop varicose veins in your legs, wear support hose. Elevate your feet for 15 minutes, 3-4 times a day. Limit salt in your diet. Prenatal Care Schedule your prenatal visits by the  twelfth week of pregnancy. They are usually scheduled monthly at first, then more often in the last 2 months before delivery. Write down your questions. Take them to your prenatal visits. Keep all your prenatal visits as directed by your health care provider. Safety Wear your seat belt at all times when driving. Make a list of emergency phone numbers, including numbers for family, friends, the hospital, and police and fire departments. General Tips Ask your health care provider for a referral to a local prenatal education class. Begin classes no later than at the beginning of month 6 of your pregnancy. Ask for help if you have counseling or nutritional needs during pregnancy. Your health care provider can offer advice or refer you to specialists for help with various needs. Do not use hot tubs, steam rooms, or saunas. Do not douche or use tampons or scented sanitary pads. Do not cross your legs for long periods of time. Avoid cat litter boxes and soil used by cats. These carry germs that can cause birth defects in the baby and possibly loss of the fetus by miscarriage or stillbirth. Avoid all smoking, herbs, alcohol, and medicines not prescribed by your health care provider. Chemicals in these affect the formation and growth of the baby. Schedule a dentist appointment. At home, brush your teeth with a soft toothbrush and be gentle when you floss. SEEK MEDICAL CARE IF:   You have dizziness. You have mild pelvic cramps, pelvic pressure, or nagging pain in the abdominal area. You have persistent nausea, vomiting, or diarrhea. You have a bad smelling vaginal discharge. You have pain with urination. You notice increased swelling in your face, hands, legs, or ankles. SEEK IMMEDIATE MEDICAL CARE IF:  You have a fever. You are leaking fluid from your vagina. You have spotting or bleeding from your vagina. You have severe abdominal cramping or pain. You have rapid weight gain or loss. You vomit blood or material that looks like coffee grounds. You are exposed to [place] measles and have never had them. You are exposed to fifth disease or chickenpox. You develop a severe headache. You have shortness of breath. You have any kind of trauma, such as from a fall or a car accident. Document Released: 12/29/2000 Document Revised: 05/21/2013 Document Reviewed: 11/14/2012 [facility] Patient Information 2015 [place], [place]. This information is not intended to replace advice given to you by your health care provider. Make sure you discuss any questions you have with your health care provider.

## 2023-03-02 NOTE — Progress Notes (Signed)
INITIAL OBSTETRICAL VISIT Patient name: Toni Moss MRN [id number]  Date of birth: 2000-03-12 Chief Complaint:   Initial Prenatal Visit (Zofran not working, phenergan making her feel "drugged")  History of Present Illness:   Toni Moss is a 23 y.o. G65P0000 Caucasian female at [redacted]w[redacted]d by LMP c/w u/s at 7.0 weeks with an Estimated Date of Delivery: 09/13/23 being seen today for her initial obstetrical visit.   Patient's last menstrual period was 12/07/2022 (exact date). Her obstetrical history is significant for primigravida.   Today she reports  feels like her heart is beating fast; hasn't kept much down in the past 2 weeks- prior to that Zofran was helping; doesn't like the way phenergan makes her feel .  Last pap <21yo. Results were: N/A     03/02/2023    2:24 PM 12/20/2022    2:45 PM 12/20/2022    1:14 PM 11/04/2022    9:34 AM 10/28/2022    2:21 PM  Depression screen PHQ 2/9  Decreased Interest 2 1 3 3    Down, Depressed, Hopeless 2 1 3 3    PHQ - 2 Score 4 2 6 6    Altered sleeping 2 1 3 3    Tired, decreased energy 2 3 3 3    Change in appetite 2 2 3 3    Feeling bad or failure about yourself  2 0 3 3   Trouble concentrating 2 3 3 3    Moving slowly or fidgety/restless 2  3 3    Suicidal thoughts 0 0 0 0   PHQ-9 Score 16 11 24 24    Difficult doing work/chores  Very difficult  Extremely dIfficult      Information is confidential and restricted. Go to Review Flowsheets to unlock data.        03/02/2023    2:24 PM 12/20/2022    1:15 PM 11/04/2022    9:35 AM 12/02/2021    3:01 PM  GAD 7 : Generalized Anxiety Score  Nervous, Anxious, on Edge 2 3 3 3   Control/stop worrying 2 3 3 3   Worry too much - different things 2 3 3 3   Trouble relaxing 2 3 3 1   Restless 2 3 2 2   Easily annoyed or irritable 3 3 3 3   Afraid - awful might happen 3 3 2 2   Total GAD 7 Score 16 21 19 17   Anxiety Difficulty  Extremely difficult Extremely difficult Very difficult     Review of Systems:    Pertinent items are noted in HPI Denies cramping/contractions, leakage of fluid, vaginal bleeding, abnormal vaginal discharge w/ itching/odor/irritation, headaches, visual changes, shortness of breath, chest pain, abdominal pain, severe nausea/vomiting, or problems with urination or bowel movements unless otherwise stated above.  Pertinent History Reviewed:  Reviewed past medical,surgical, social, obstetrical and family history.  Reviewed problem list, medications and allergies. OB History  Gravida Para Term Preterm AB Living  1 0 0 0 0 0  SAB IAB Ectopic Multiple Live Births  0 0 0 0 0    # Outcome Date GA Lbr Len/2nd Weight Sex Type Anes PTL Lv  1 Current            Physical Assessment:   Vitals:   03/02/23 1400  BP: (!) 111/90  Pulse: (!) 162  Weight: 174 lb (78.9 kg)  Body mass index is 28.96 kg/m.       Physical Examination:  General appearance - well appearing, and in no distress  Mental status - alert, oriented to  person, place, and time  Psych:  She has a normal mood and affect  Skin - warm and dry, normal color, no suspicious lesions noted  Chest - effort normal, all lung fields clear to auscultation bilaterally  Heart - normal rate and regular rhythm  Abdomen - soft, nontender  Extremities:  No swelling or varicosities noted  Pelvic - not examined  Thin prep pap is not done   TODAY'S NT [place] 12+1 wks,measurements c/w dates,FHR 165 bpm,anterior placenta,normal ovaries,NB present,NT 1.7 mm,CRL 63.92 mm   No results found for this or any previous visit (from the past 24 hours).  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   Initial labs obtained Continue prenatal vitamins Reviewed n/v relief measures and warning s/s to report Reviewed recommended weight gain based on pre-gravid BMI Encouraged well-balanced diet Genetic & carrier screening discussed: requests Panorama and NT/IT, requests Horizon  Ultrasound discussed; fetal survey: requested [facility]NC completed> form faxed if has or is planning to apply for medicaid The nature of [place] [facility] [employer] with multiple MDs and other Advanced Practice Providers was explained to patient; also emphasized that fellows, residents, and students are part of our team. Does not have home bp cuff. Office bp cuff given: yes. Rx sent: no. Check bp weekly, let us know if consistently >140/90.   No indications for ASA therapy (per uptodate)  Follow-up: Return for 2wk LROB for N/V f/u; 4wk LROB & 2nd IT; 8wk LROB & anatomy u/s.   Orders Placed This Encounter  Procedures   Urine Culture   GC/Chlamydia Probe Amp   US OB Comp + 14 Wk   Integrated 1   CBC/D/Plt+RPR+Rh+ABO+RubIgG...   Hemoglobin A1c   PANORAMA PRENATAL TEST   HORIZON CUSTOM    [facility] 03/02/2023 2:59 PM

## 2023-03-02 NOTE — Progress Notes (Signed)
[place] 12+1 wks,measurements c/w dates,FHR 165 bpm,anterior placenta,normal ovaries,NB  present,NT 1.7 mm,CRL 63.92 mm
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
## 2023-03-02 NOTE — Progress Notes (Deleted)
Marland Kitchen

## 2023-03-02 NOTE — MAU Note (Signed)
.  Toni Moss is a 23 y.o. at [redacted]w[redacted]d here in MAU reporting: has been vomiting for 3 weeks not able to keep anything down. Was on zofran but stopped working. Started on promethazine but it makes her fell like "a Zombie" and it does not help much either. Went to [facility] appointment to day and they told her that she was severally dehydrated and her HR was high. Reports Epigastric pain and a "knot in her chest."  LMP:  Onset of complaint: 3 weeks Pain score: 5 Vitals:   03/02/23 1844  BP: 123/85  Pulse: (!) 149  Resp: 18  Temp: 97.8 F (36.6 C)     FHT:   Lab orders placed from triage: 157

## 2023-03-02 NOTE — MAU Provider Note (Incomplete Revision)
History     CSN: [id number]  Arrival date and time: 03/02/23 1637   Event Date/Time   First Provider Initiated Contact with Patient 03/02/23 2023      Chief Complaint  Patient presents with   Emesis   Toni Moss , a  22 y.o. G1P0000 at [redacted]w[redacted]d presents to MAU after being sent from the office for 1/V; dehydrated w tachycardia up into the 140's.   Patient reports that she has had nausea and vomiting for the last 3 weeks. She noted that she was taking Zofran and it was helping until about 1 week ago. She states that she tried taking Promethazine this week but it made her feel like a "zombie" and so she stopped taking that as well. She states that she has not been able to keep anything down for the last 4 days. She states that they last time she tried to keep anything down was yesterday. She also reports pain in the center of her chest that she describes as a "knot." She states that it has been there for the last 4 days reports feels better with ice. She states that she has not ran a fever, but states that 2 days ago she felt so hot that she took an ice bath. She also reports that 4 days ago she noted that her urine was starting to get dark in color. Today she states that she thinks she has blood in it denies pain with urination. She denies SOB, but noted that she feels like her heart is racing. She denies problems or complications with gallbladder, liver, appendix, or kidneys.   When asked if patient has had a recent infection she reports that less than 1 month ago she was treated for pneumonia at [facility].        Emesis  Associated symptoms include chest pain. Pertinent negatives include no abdominal pain, chills, diarrhea, fever or headaches.    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   Chlamydia 02/25/2020   Treated 02/24/20 at Urgent Care with doxy, POC___________   Depression    IBS (irritable bowel syndrome)    Panic disorder with agoraphobia    Personality disorder (HCC)    PTSD (post-traumatic stress disorder)    Tic disorder     Past Surgical History:  Procedure Laterality Date   TYMPANOSTOMY TUBE PLACEMENT      Family History  Problem Relation Age of Onset   Depression Mother    Colon polyps Mother    Tourette syndrome Sister    Colon polyps Maternal Aunt    Alcohol abuse Maternal Uncle    Depression Maternal Grandmother    Colon polyps Maternal Grandmother    Lung cancer Paternal Grandmother    Tics Cousin    Bipolar disorder Other    Colon cancer Neg Hx    Esophageal cancer Neg Hx    Rectal cancer Neg Hx  Stomach cancer Neg Hx     Social History   Tobacco Use   Smoking status: Never   Smokeless tobacco: Never  Vaping Use   Vaping status: Never Used  Substance Use Topics   Alcohol use: No   Drug use: No    Allergies:  Allergies  Allergen Reactions   Doxycycline Nausea And Vomiting   Penicillins Hives   Prozac [Fluoxetine Hcl]     Medications Prior to Admission  Medication Sig Dispense Refill Last Dose/Taking   promethazine (PHENERGAN) 25 MG tablet Take 1 tablet (25 mg total) by mouth every 6 (six) hours as needed for nausea or vomiting. (Patient not taking: Reported on 03/02/2023) 30 tablet 1    [START ON 03/09/2023] lamoTRIgine (LAMICTAL) 100 MG tablet Take 1 tablet (100 mg total) by mouth at bedtime. After completing 50 mg nightly dose. (Patient not taking: Reported on 03/02/2023) 30 tablet 1    lamoTRIgine (LAMICTAL) 25 MG tablet Take 1 tablet nightly for 2 weeks.  Then take 2 tablets nightly for 2 weeks. (Patient not taking: Reported on 03/02/2023) 42 tablet 0    ondansetron (ZOFRAN-ODT) 4 MG  disintegrating tablet Take 1 tablet (4 mg total) by mouth every 12 (twelve) hours as needed for nausea. (Patient not taking: Reported on 03/02/2023) 30 tablet 1    Prenatal Vit-Fe Fumarate-FA (PRENATAL VITAMIN PO) Take by mouth.       Review of Systems  Constitutional:  Positive for fatigue. Negative for chills and fever.  Eyes:  Negative for pain and visual disturbance.  Respiratory:  Negative for apnea, shortness of breath and wheezing.   Cardiovascular:  Positive for chest pain. Negative for palpitations.  Gastrointestinal:  Positive for nausea and vomiting. Negative for abdominal pain, constipation and diarrhea.  Genitourinary:  Positive for decreased urine volume, difficulty urinating and hematuria. Negative for dysuria, pelvic pain, vaginal bleeding, vaginal discharge and vaginal pain.  Musculoskeletal:  Negative for back pain.  Skin:  Positive for color change.  Neurological:  Positive for weakness and light-headedness. Negative for seizures and headaches.  Psychiatric/Behavioral:  Negative for suicidal ideas.    Physical Exam   Blood pressure 123/85, pulse (!) 149, temperature 97.8 F (36.6 C), resp. rate 18, height 5\' 5"  (1.651 m), weight 78.9 kg, last menstrual period 12/07/2022.  Physical Exam Vitals and nursing note reviewed.  Constitutional:      General: She is not in acute distress.    Appearance: She is ill-appearing.  HENT:     Head: Normocephalic.  Eyes:     Pupils: Pupils are equal, round, and reactive to light.     Comments: Both eyes have a yellowish tint to them.   Cardiovascular:     Rate and Rhythm: Tachycardia present.     Heart sounds: Normal heart sounds.  Pulmonary:     Effort: Pulmonary effort is normal.     Breath sounds: Examination of the right-upper field reveals decreased breath sounds. Examination of the right-middle field reveals decreased breath sounds. Examination of the right-lower field reveals decreased breath sounds. Decreased breath sounds  present.  Abdominal:     Palpations: Abdomen is soft.     Tenderness: There is no abdominal tenderness.  Genitourinary:    Comments: CNM was able to see urine, prior to sending to lab, dark tea colored urine with large quantity of sediment noted.  Musculoskeletal:     Cervical back: Normal range of motion.  Skin:    General: Skin is warm and dry.  Capillary Refill: Capillary refill takes more than 3 seconds.     Coloration: Skin is pale.  Neurological:     Mental Status: She is alert and oriented to person, place, and time.  Psychiatric:        Mood and Affect: Mood normal.     MAU Course  Procedures Orders Placed This Encounter  Procedures   Culture, blood (x 2)   US ABDOMEN LIMITED RUQ (LIVER/GB)   DG Chest Port 1 View   US Abdomen Complete   Urinalysis, Routine w reflex microscopic -Urine, Clean Catch   CBC with Differential/Platelet   Comprehensive metabolic panel   Amylase   Lipase, blood   Lactic acid, plasma   Procalcitonin   D-dimer, quantitative   Magnesium   Refer to Sidebar Report: Sepsis Bundle ED/IP   Apply Sepsis Care Plan   If lactate (lactic acid) >2, verify repeat lactic acid order has been placed to be drawn   Document vital signs within 1-hour of fluid bolus completion and notify provider of bolus completion   Vital signs   Vital signs   Assess and Document Glasgow Coma Scale   RN to call RRT (rapid response team) and provider   Cardiac Monitoring Continuous x 12 hours Indications for use: Other; other indications for use: Sinus Tach   Code Sepsis activation.  This occurs automatically when order is signed and prioritizes pharmacy, lab, and radiology services for STAT collections and interventions.  If [facility] downtime, call [facility] [phone]) to activate Code Sepsis.   ED EKG   Type and screen [facility]   Insert peripheral IV   Meds ordered this encounter  Medications   DISCONTD: lactated ringers bolus 1,000 mL    DISCONTD: lactated ringers bolus 1,000 mL    Total Body Weight basis for 30 mL/kg  bolus delivery:   78.9 kg   DISCONTD: lactated ringers bolus 1,000 mL    Total Body Weight basis for 30 mL/kg  bolus delivery:   78.9 kg   DISCONTD: lactated ringers bolus 500 mL    Total Body Weight basis for 30 mL/kg  bolus delivery:   78.9 kg   cefTRIAXone (ROCEPHIN) 2 g in sodium chloride 0.9 % 100 mL IVPB    Antibiotic Indication::   Other Indication (list below)    Other Indication::   sepsis   metroNIDAZOLE (FLAGYL) IVPB 500 mg    Antibiotic Indication::   Other Indication (list below)    Other Indication::   sepsis   sucralfate (CARAFATE) 1 GM/10ML suspension 1 g   0.9 %  sodium chloride infusion   0.9 %  sodium chloride infusion    Results for orders placed or performed during the hospital encounter of 03/02/23 (from the past 24 hours)  CBC with Differential/Platelet     Status: Abnormal   Collection Time: 03/02/23  7:36 PM  Result Value Ref Range   WBC 22.5 (H) 4.0 - 10.5 K/uL   RBC 5.44 (H) 3.87 - 5.11 MIL/uL   Hemoglobin 16.1 (H) 12.0 - 15.0 g/dL   HCT 09.8 11.9 - 14.7 %   MCV 82.4 80.0 - 100.0 fL   MCH 29.6 26.0 - 34.0 pg   MCHC 35.9 30.0 - 36.0 g/dL   RDW 82.9 56.2 - 13.0 %   Platelets 430 (H) 150 - 400 K/uL   nRBC 0.0 0.0 - 0.2 %   Neutrophils Relative % 83 %   Neutro Abs 18.6 (H) 1.7 - 7.7 K/uL  Lymphocytes Relative 10 %   Lymphs Abs 2.3 0.7 - 4.0 K/uL   Monocytes Relative 6 %   Monocytes Absolute 1.4 (H) 0.1 - 1.0 K/uL   Eosinophils Relative 0 %   Eosinophils Absolute 0.1 0.0 - 0.5 K/uL   Basophils Relative 0 %   Basophils Absolute 0.1 0.0 - 0.1 K/uL   Immature Granulocytes 1 %   Abs Immature Granulocytes 0.14 (H) 0.00 - 0.07 K/uL  Comprehensive metabolic panel     Status: Abnormal   Collection Time: 03/02/23  7:36 PM  Result Value Ref Range   Sodium 133 (L) 135 - 145 mmol/L   Potassium 2.7 (LL) 3.5 - 5.1 mmol/L   Chloride 96 (L) 98 - 111 mmol/L   CO2 17 (L) 22 - 32  mmol/L   Glucose, Bld 121 (H) 70 - 99 mg/dL   BUN 19 6 - 20 mg/dL   Creatinine, Ser 7.25 (H) 0.44 - 1.00 mg/dL   Calcium 36.6 8.9 - 44.0 mg/dL   Total Protein 7.8 6.5 - 8.1 g/dL   Albumin 3.8 3.5 - 5.0 g/dL   AST 347 (H) 15 - 41 U/L   ALT 191 (H) 0 - 44 U/L   Alkaline Phosphatase 103 38 - 126 U/L   Total Bilirubin 3.3 (H) 0.0 - 1.2 mg/dL   GFR, Estimated >42 >59 mL/min   Anion gap 20 (H) 5 - 15    MDM - Sepsis protocol initiated  - Chest X-ray ordered.  - Transition of care to Wynelle Bourgeois, CNM @ [address] ([address]) Suzie Portela, MSN, CNM  [facility]  03/02/2023 9:25 PM    Assumed care prior to admission to [facility] Vitals:   03/02/23 1844 03/02/23 2042 03/02/23 2207 03/02/23 2318  BP: 123/85 (!) 122/98 105/76 111/81   03/02/23 2333  BP: 117/74   Heart rate came down to 120s after hydration.  Results for orders placed or performed during the hospital encounter of 03/02/23 (from the past 24 hours)  Urinalysis, Routine w reflex microscopic -Urine, Clean Catch     Status: Abnormal   Collection Time: 03/02/23  6:43 PM  Result Value Ref Range   Color, Urine AMBER (A) YELLOW   APPearance TURBID (A) CLEAR   Specific Gravity, Urine 1.020 1.005 - 1.030   pH 5.0 5.0 - 8.0   Glucose, UA NEGATIVE NEGATIVE mg/dL   Hgb urine dipstick SMALL (A) NEGATIVE   Bilirubin Urine MODERATE (A) NEGATIVE   Ketones, ur 20 (A) NEGATIVE mg/dL   Protein, ur 563 (A) NEGATIVE mg/dL   Nitrite NEGATIVE NEGATIVE   Leukocytes,Ua NEGATIVE NEGATIVE   RBC / HPF 11-20 0 - 5 RBC/hpf   WBC, UA 6-10 0 - 5 WBC/hpf   Bacteria, UA MANY (A) NONE SEEN   Squamous Epithelial / HPF 21-50 0 - 5 /HPF   Mucus PRESENT    Hyaline Casts, UA PRESENT    Ca Oxalate Crys, UA PRESENT    Crystals AMMONIUM BIURATE CRYSTALS PRESENT (A) NEGATIVE  CBC with Differential/Platelet     Status: Abnormal   Collection Time: 03/02/23  7:36 PM  Result Value Ref Range   WBC 22.5 (H) 4.0 - 10.5 K/uL   RBC  5.44 (H) 3.87 - 5.11 MIL/uL   Hemoglobin 16.1 (H) 12.0 - 15.0 g/dL   HCT 87.5 64.3 - 32.9 %   MCV 82.4 80.0 - 100.0 fL   MCH 29.6 26.0 - 34.0 pg   MCHC 35.9 30.0 - 36.0

## 2023-03-02 NOTE — MAU Note (Signed)
CRITICAL VALUE STICKER  CRITICAL VALUE: K 2.7  RECEIVER (on-site recipient of call): Boone Master, RN   DATE & TIME NOTIFIED: 2/12 2029  MESSENGER (representative from lab): Toney Reil, lab tech  MD NOTIFIED: S. Suzie Portela, CNM   TIME OF NOTIFICATION: 2029  RESPONSE: no new orders at this time.

## 2023-03-02 NOTE — Progress Notes (Signed)
[facility] monitoring for the code sepsis protocol.

## 2023-03-03 ENCOUNTER — Encounter: Payer: Self-pay | Admitting: Advanced Practice Midwife

## 2023-03-03 ENCOUNTER — Encounter (HOSPITAL_COMMUNITY): Payer: Self-pay | Admitting: Obstetrics & Gynecology

## 2023-03-03 ENCOUNTER — Inpatient Hospital Stay (HOSPITAL_COMMUNITY): Payer: Managed Care, Other (non HMO)

## 2023-03-03 ENCOUNTER — Other Ambulatory Visit: Payer: Self-pay

## 2023-03-03 DIAGNOSIS — O09899 Supervision of other high risk pregnancies, unspecified trimester: Secondary | ICD-10-CM | POA: Insufficient documentation

## 2023-03-03 DIAGNOSIS — O26891 Other specified pregnancy related conditions, first trimester: Secondary | ICD-10-CM

## 2023-03-03 DIAGNOSIS — Z2839 Other underimmunization status: Secondary | ICD-10-CM | POA: Insufficient documentation

## 2023-03-03 LAB — CBC
HCT: 31.6 % — ABNORMAL LOW (ref 36.0–46.0)
Hemoglobin: 11.2 g/dL — ABNORMAL LOW (ref 12.0–15.0)
MCH: 29.8 pg (ref 26.0–34.0)
MCHC: 35.4 g/dL (ref 30.0–36.0)
MCV: 84 fL (ref 80.0–100.0)
Platelets: 255 10*3/uL (ref 150–400)
RBC: 3.76 MIL/uL — ABNORMAL LOW (ref 3.87–5.11)
RDW: 13.3 % (ref 11.5–15.5)
WBC: 14.7 10*3/uL — ABNORMAL HIGH (ref 4.0–10.5)
nRBC: 0 % (ref 0.0–0.2)

## 2023-03-03 LAB — COMPREHENSIVE METABOLIC PANEL
ALT: 135 U/L — ABNORMAL HIGH (ref 0–44)
AST: 75 U/L — ABNORMAL HIGH (ref 15–41)
Albumin: 2.5 g/dL — ABNORMAL LOW (ref 3.5–5.0)
Alkaline Phosphatase: 67 U/L (ref 38–126)
Anion gap: 16 — ABNORMAL HIGH (ref 5–15)
BUN: 13 mg/dL (ref 6–20)
CO2: 20 mmol/L — ABNORMAL LOW (ref 22–32)
Calcium: 8.8 mg/dL — ABNORMAL LOW (ref 8.9–10.3)
Chloride: 101 mmol/L (ref 98–111)
Creatinine, Ser: 0.76 mg/dL (ref 0.44–1.00)
GFR, Estimated: >60 mL/min (ref >60)
Glucose, Bld: 104 mg/dL — ABNORMAL HIGH (ref 70–99)
Potassium: 2.7 mmol/L — CL (ref 3.5–5.1)
Sodium: 137 mmol/L (ref 135–145)
Total Bilirubin: 1.8 mg/dL — ABNORMAL HIGH (ref 0.0–1.2)
Total Protein: 5.2 g/dL — ABNORMAL LOW (ref 6.5–8.1)

## 2023-03-03 LAB — HEPATITIS PANEL, ACUTE
HCV Ab: NONREACTIVE
Hep A IgM: NONREACTIVE
Hep B C IgM: NONREACTIVE
Hepatitis B Surface Ag: NONREACTIVE

## 2023-03-03 LAB — INTEGRATED 1

## 2023-03-03 MED ORDER — POTASSIUM CHLORIDE 10 MEQ/100ML IV SOLN
10.0000 meq | INTRAVENOUS | Status: AC
  Administered 2023-03-03 (×4): 10 meq via INTRAVENOUS
  Filled 2023-03-03 (×4): qty 100

## 2023-03-03 MED ORDER — ONDANSETRON 4 MG PO TBDP
4.0000 mg | ORAL_TABLET | Freq: Four times a day (QID) | ORAL | Status: DC | PRN
  Administered 2023-03-03: 4 mg via ORAL
  Filled 2023-03-03: qty 1

## 2023-03-03 NOTE — Consult Note (Signed)
 Reason for Consult: Abdominal pain Referring Physician: Dr. Shon Baton is an 23 y.o. female.  HPI: Patient is a 23 year old female who comes in [redacted] weeks pregnant.  Patient states that she has been having several year history of abdominal pain.  States that over the last several months basically since the beginning of her pregnancy she has been having some heartburn like pain, pain in her epigastrium and some pain to the right upper quadrant.  She states that is not significantly related to food and occurs with meals.  She came into the the hospital secondary to continued nausea vomiting and hematemesis.  Upon evaluation patient underwent ultrasound and MRI.  Ultrasound was significant for sludge and small stones.  MRI was negative for any common duct stones.  There is no cholecystitis seen on either study.  I did review the CT scan and ultrasound personally.  Patient did have a elevated white count.  Patient also with an elevated T. bili of 1.8.  Patient states that she had been followed over the last several years and was told she was too young to have her gallbladder removed this was approximate the age of 57 or 23 years old.    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   Chlamydia 02/25/2020   Treated 02/24/20 at Urgent Care with doxy, POC___________   Depression    IBS (irritable bowel syndrome)    Panic disorder with agoraphobia    Personality disorder (HCC)    PTSD (post-traumatic stress disorder)    Tic disorder     Past Surgical History:  Procedure Laterality Date   TYMPANOSTOMY TUBE PLACEMENT      Family History  Problem Relation Age of Onset   Depression Mother    Colon polyps Mother    Tourette syndrome Sister    Colon polyps Maternal Aunt    Alcohol abuse Maternal Uncle    Depression Maternal Grandmother    Colon  polyps Maternal Grandmother    Lung cancer Paternal Grandmother    Tics Cousin    Bipolar disorder Other    Colon cancer Neg Hx    Esophageal cancer Neg Hx    Rectal cancer Neg Hx    Stomach cancer Neg Hx     Social History:  reports that she has never smoked. She has never used smokeless tobacco. She reports that she does not drink alcohol and does not use drugs.  Allergies:  Allergies  Allergen Reactions   Doxycycline Nausea And Vomiting   Penicillins Hives   Prozac [Fluoxetine Hcl]     Medications: I have reviewed the patient's current medications.  Results for orders placed or performed during the hospital encounter of 03/02/23 (from the past 48 hours)  Urinalysis, Routine w reflex microscopic -Urine, Clean Catch     Status: Abnormal   Collection Time: 03/02/23  6:43 PM  Result Value Ref Range   Color, Urine AMBER (A) YELLOW    Comment: BIOCHEMICALS MAY BE AFFECTED BY COLOR   APPearance TURBID (A) CLEAR   Specific Gravity, Urine 1.020 1.005 - 1.030   pH 5.0 5.0 - 8.0   Glucose, UA NEGATIVE NEGATIVE mg/dL   Hgb urine dipstick SMALL (A) NEGATIVE   Bilirubin Urine MODERATE (A) NEGATIVE   Ketones, ur 20 (A) NEGATIVE mg/dL   Protein, ur 161 (A) NEGATIVE mg/dL  Nitrite NEGATIVE NEGATIVE   Leukocytes,Ua NEGATIVE NEGATIVE   RBC / HPF 11-20 0 - 5 RBC/hpf   WBC, UA 6-10 0 - 5 WBC/hpf   Bacteria, UA MANY (A) NONE SEEN   Squamous Epithelial / HPF 21-50 0 - 5 /HPF   Mucus PRESENT    Hyaline Casts, UA PRESENT    Ca Oxalate Crys, UA PRESENT    Crystals AMMONIUM BIURATE CRYSTALS PRESENT (A) NEGATIVE    Comment: Performed at [facility], [address] [place], [place] [address]  CBC with Differential/Platelet     Status: Abnormal   Collection Time: 03/02/23  7:36 PM  Result Value Ref Range   WBC 22.5 (H) 4.0 - 10.5 K/uL   RBC 5.44 (H) 3.87 - 5.11 MIL/uL   Hemoglobin 16.1 (H) 12.0 - 15.0 g/dL   HCT 96.2 95.2 - 84.1 %   MCV 82.4 80.0 - 100.0 fL   MCH 29.6 26.0 - 34.0  pg   MCHC 35.9 30.0 - 36.0 g/dL   RDW 32.4 40.1 - 02.7 %   Platelets 430 (H) 150 - 400 K/uL   nRBC 0.0 0.0 - 0.2 %   Neutrophils Relative % 83 %   Neutro Abs 18.6 (H) 1.7 - 7.7 K/uL   Lymphocytes Relative 10 %   Lymphs Abs 2.3 0.7 - 4.0 K/uL   Monocytes Relative 6 %   Monocytes Absolute 1.4 (H) 0.1 - 1.0 K/uL   Eosinophils Relative 0 %   Eosinophils Absolute 0.1 0.0 - 0.5 K/uL   Basophils Relative 0 %   Basophils Absolute 0.1 0.0 - 0.1 K/uL   Immature Granulocytes 1 %   Abs Immature Granulocytes 0.14 (H) 0.00 - 0.07 K/uL    Comment: Performed at [facility], [address], [place], [place] [address]  Comprehensive metabolic panel     Status: Abnormal   Collection Time: 03/02/23  7:36 PM  Result Value Ref Range   Sodium 133 (L) 135 - 145 mmol/L   Potassium 2.7 (LL) 3.5 - 5.1 mmol/L    Comment: CRITICAL RESULT CALLED TO, READ BACK BY AND VERIFIED WITH Boone Master, RN AT 2029 02.12.25 D. BLU   Chloride 96 (L) 98 - 111 mmol/L   CO2 17 (L) 22 - 32 mmol/L   Glucose, Bld 121 (H) 70 - 99 mg/dL    Comment: Glucose reference range applies only to samples taken after fasting for at least 8 hours.   BUN 19 6 - 20 mg/dL   Creatinine, Ser 4.40 (H) 0.44 - 1.00 mg/dL   Calcium 34.7 8.9 - 42.5 mg/dL   Total Protein 7.8 6.5 - 8.1 g/dL   Albumin 3.8 3.5 - 5.0 g/dL   AST 956 (H) 15 - 41 U/L   ALT 191 (H) 0 - 44 U/L   Alkaline Phosphatase 103 38 - 126 U/L   Total Bilirubin 3.3 (H) 0.0 - 1.2 mg/dL   GFR, Estimated >38 >75 mL/min    Comment: (NOTE) Calculated using the CKD-EPI Creatinine Equation (2021)    Anion gap 20 (H) 5 - 15    Comment: Performed at [facility], [address] [place], [place] [address]  Culture, blood (x 2)     Status: None (Preliminary result)   Collection Time: 03/02/23  9:23 PM   Specimen: BLOOD  Result Value Ref Range   Specimen Description BLOOD SITE NOT SPECIFIED    Special Requests      BOTTLES DRAWN AEROBIC AND  ANAEROBIC Blood Culture  adequate volume   Culture      NO GROWTH < 12 HOURS Performed at [facility], [address], [place], [place] [address]    Report Status PENDING   Amylase     Status: None   Collection Time: 03/02/23  9:53 PM  Result Value Ref Range   Amylase 94 28 - 100 U/L    Comment: Performed at [facility], [address], [place], [place] [address]  Lipase, blood     Status: Abnormal   Collection Time: 03/02/23  9:53 PM  Result Value Ref Range   Lipase 59 (H) 11 - 51 U/L    Comment: Performed at [facility], [address], [place], [place] [address]  Lactic acid, plasma     Status: None   Collection Time: 03/02/23  9:53 PM  Result Value Ref Range   Lactic Acid, Venous 1.4 0.5 - 1.9 mmol/L    Comment: Performed at [facility], [address], [place], [place] [address]  Procalcitonin     Status: None   Collection Time: 03/02/23  9:53 PM  Result Value Ref Range   Procalcitonin 0.22 ng/mL    Comment:        Interpretation: PCT (Procalcitonin) <= 0.5 ng/mL: Systemic infection (sepsis) is not likely. Local bacterial infection is possible. (NOTE)       Sepsis PCT Algorithm           Lower Respiratory Tract                                      Infection PCT Algorithm    ----------------------------     ----------------------------         PCT < 0.25 ng/mL                PCT < 0.10 ng/mL          Strongly encourage             Strongly discourage   discontinuation of antibiotics    initiation of antibiotics    ----------------------------     -----------------------------       PCT 0.25 - 0.50 ng/mL            PCT 0.10 - 0.25 ng/mL               OR       >80% decrease in PCT            Discourage initiation of                                            antibiotics      Encourage discontinuation           of antibiotics    ----------------------------     -----------------------------         PCT >= 0.50 ng/mL              PCT 0.26 - 0.50 ng/mL                AND        <80% decrease in PCT             Encourage initiation of  antibiotics       Encourage continuation           of antibiotics    ----------------------------     -----------------------------        PCT >= 0.50 ng/mL                  PCT > 0.50 ng/mL               AND         increase in PCT                  Strongly encourage                                      initiation of antibiotics    Strongly encourage escalation           of antibiotics                                     -----------------------------                                           PCT <= 0.25 ng/mL                                                 OR                                        > 80% decrease in PCT                                      Discontinue / Do not initiate                                             antibiotics  Performed at [facility], [address] [place], [place] [address]   Type and screen [facility]     Status: None   Collection Time: 03/02/23  9:53 PM  Result Value Ref Range   ABO/RH(D) A NEG    Antibody Screen NEG    Sample Expiration      03/05/2023,2359 Performed at [facility], [address], [place], [place] [address]   D-dimer, quantitative     Status: Abnormal   Collection Time: 03/02/23  9:53 PM  Result Value Ref Range   D-Dimer, Quant 0.59 (H) 0.00 - 0.50 ug/mL-FEU    Comment: (NOTE) At the manufacturer cut-off value of 0.5 g/mL FEU, this assay has a negative predictive value of 95-100%.This assay is intended for use in conjunction with a clinical pretest probability (PTP) assessment model to exclude pulmonary embolism (PE) and deep venous thrombosis (DVT) in outpatients suspected of PE or DVT. Results should be correlated with clinical presentation.  Performed at [facility], [address] [place], [place] [address]   Magnesium     Status: None   Collection Time:  03/02/23  9:53 PM  Result Value Ref Range   Magnesium 2.4 1.7 - 2.4 mg/dL    Comment: Performed at [facility], [address], [place], [place] [address]  Bilirubin, direct     Status: Abnormal   Collection Time: 03/02/23  9:53 PM  Result Value Ref Range   Bilirubin, Direct 1.5 (H) 0.0 - 0.2 mg/dL    Comment: Performed at [facility], [address], [place], [place] [address]  Hepatitis panel, acute     Status: None   Collection Time: 03/03/23 12:23 AM  Result Value Ref Range   Hepatitis B Surface Ag NON REACTIVE NON REACTIVE   HCV Ab NON REACTIVE NON REACTIVE    Comment: (NOTE) Nonreactive HCV antibody screen is consistent with no HCV infections,  unless recent infection is suspected or other evidence exists to indicate HCV infection.     Hep A IgM NON REACTIVE NON REACTIVE   Hep B C IgM NON REACTIVE NON REACTIVE    Comment: Performed at [facility], [address] [place], [place] [address]  CBC     Status: Abnormal   Collection Time: 03/03/23  5:06 AM  Result Value Ref Range   WBC 14.7 (H) 4.0 - 10.5 K/uL   RBC 3.76 (L) 3.87 - 5.11 MIL/uL   Hemoglobin 11.2 (L) 12.0 - 15.0 g/dL    Comment: REPEATED TO VERIFY   HCT 31.6 (L) 36.0 - 46.0 %   MCV 84.0 80.0 - 100.0 fL   MCH 29.8 26.0 - 34.0 pg   MCHC 35.4 30.0 - 36.0 g/dL   RDW 29.5 62.1 - 30.8 %   Platelets 255 150 - 400 K/uL   nRBC 0.0 0.0 - 0.2 %    Comment: Performed at [facility], [address] [place], [place] [address]  Comprehensive metabolic panel     Status: Abnormal   Collection Time: 03/03/23  5:06 AM  Result Value Ref Range   Sodium 137 135 - 145 mmol/L   Potassium 2.7 (LL) 3.5 - 5.1 mmol/L    Comment: CRITICAL RESULT CALLED TO, READ BACK BY AND VERIFIED WITH Para March RN [phone] M. ALAMANO   Chloride 101 98 - 111 mmol/L   CO2 20 (L) 22 - 32 mmol/L   Glucose, Bld 104 (H) 70 - 99 mg/dL    Comment: Glucose reference range applies only to samples taken after fasting for at  least 8 hours.   BUN 13 6 - 20 mg/dL   Creatinine, Ser 3.24 0.44 - 1.00 mg/dL   Calcium 8.8 (L) 8.9 - 10.3 mg/dL   Total Protein 5.2 (L) 6.5 - 8.1 g/dL   Albumin 2.5 (L) 3.5 - 5.0 g/dL   AST 75 (H) 15 - 41 U/L   ALT 135 (H) 0 - 44 U/L   Alkaline Phosphatase 67 38 - 126 U/L   Total Bilirubin 1.8 (H) 0.0 - 1.2 mg/dL   GFR, Estimated >40 >10 mL/min    Comment: (NOTE) Calculated using the CKD-EPI Creatinine Equation (2021)    Anion gap 16 (H) 5 - 15    Comment: Performed at [facility], [address], [place], [place] [address]    MR ABDOMEN MRCP WO CONTRAST Result Date: 03/03/2023 CLINICAL DATA:  23 year old female with history  of hyperbilirubinemia. Suspected choledocholithiasis. EXAM: MRI ABDOMEN WITHOUT CONTRAST  (INCLUDING MRCP) TECHNIQUE: Multiplanar multisequence MR imaging of the abdomen was performed. Heavily T2-weighted images of the biliary and pancreatic ducts were obtained, and three-dimensional MRCP images were rendered by post processing. COMPARISON:  No prior abdominal MRI. Abdominal ultrasound 03/02/2023. FINDINGS: Comment: Today's study is limited for detection and characterization of visceral and/or vascular lesions by lack of IV gadolinium. Lower chest: Unremarkable. Hepatobiliary: Subcentimeter T1 hypointense, T2 hyperintense lesions in the liver, incompletely characterized on today's noncontrast examination, but statistically likely tiny cysts and/or biliary hamartomas (no imaging follow-up recommended). MRCP images demonstrate no intra or extrahepatic biliary ductal dilatation. Common bile duct measures only 3 mm in the porta hepatis. No filling defect within the common bile duct to suggest choledocholithiasis. Gallbladder is moderately distended, but otherwise unremarkable in appearance. Pancreas: No definite pancreatic mass or peripancreatic fluid collections or inflammatory changes are noted on today's noncontrast examination. No pancreatic ductal dilatation noted on  MRCP images. Spleen:  Unremarkable. Adrenals/Urinary Tract: Bilateral kidneys and adrenal glands are normal in appearance. No hydroureteronephrosis in the visualized portions of the abdomen. Stomach/Bowel: Visualized portions are unremarkable. Vascular/Lymphatic: No aneurysm identified in the visualized abdominal vasculature on today's noncontrast examination. Other: No significant volume of ascites noted in the visualized portions of the peritoneal cavity. Musculoskeletal: No aggressive appearing osseous lesions are noted in the visualized portions of the skeleton. IMPRESSION: 1. No acute findings are noted in the abdomen. Specifically, no evidence of choledocholithiasis or biliary tract obstruction. Electronically Signed   By: Trudie Reed M.D.   On: 03/03/2023 07:20   MR 3D Recon At Scanner Result Date: 03/03/2023 CLINICAL DATA:  23 year old female with history of hyperbilirubinemia. Suspected choledocholithiasis. EXAM: MRI ABDOMEN WITHOUT CONTRAST  (INCLUDING MRCP) TECHNIQUE: Multiplanar multisequence MR imaging of the abdomen was performed. Heavily T2-weighted images of the biliary and pancreatic ducts were obtained, and three-dimensional MRCP images were rendered by post processing. COMPARISON:  No prior abdominal MRI. Abdominal ultrasound 03/02/2023. FINDINGS: Comment: Today's study is limited for detection and characterization of visceral and/or vascular lesions by lack of IV gadolinium. Lower chest: Unremarkable. Hepatobiliary: Subcentimeter T1 hypointense, T2 hyperintense lesions in the liver, incompletely characterized on today's noncontrast examination, but statistically likely tiny cysts and/or biliary hamartomas (no imaging follow-up recommended). MRCP images demonstrate no intra or extrahepatic biliary ductal dilatation. Common bile duct measures only 3 mm in the porta hepatis. No filling defect within the common bile duct to suggest choledocholithiasis. Gallbladder is moderately distended, but  otherwise unremarkable in appearance. Pancreas: No definite pancreatic mass or peripancreatic fluid collections or inflammatory changes are noted on today's noncontrast examination. No pancreatic ductal dilatation noted on MRCP images. Spleen:  Unremarkable. Adrenals/Urinary Tract: Bilateral kidneys and adrenal glands are normal in appearance. No hydroureteronephrosis in the visualized portions of the abdomen. Stomach/Bowel: Visualized portions are unremarkable. Vascular/Lymphatic: No aneurysm identified in the visualized abdominal vasculature on today's noncontrast examination. Other: No significant volume of ascites noted in the visualized portions of the peritoneal cavity. Musculoskeletal: No aggressive appearing osseous lesions are noted in the visualized portions of the skeleton. IMPRESSION: 1. No acute findings are noted in the abdomen. Specifically, no evidence of choledocholithiasis or biliary tract obstruction. Electronically Signed   By: Trudie Reed M.D.   On: 03/03/2023 07:20   US Abdomen Complete Result Date: 03/02/2023 CLINICAL DATA:  Vomiting.  Twelve weeks pregnant. EXAM: ABDOMEN ULTRASOUND COMPLETE COMPARISON:  None Available. FINDINGS: Gallbladder: Sludge and innumerable small stones. Positive sonographic Murphy's sign  noted by the sonographer. No gallbladder wall thickening or pericholecystic fluid. Common bile duct: Diameter: 1 mm. Liver: No focal lesion identified. Within normal limits in parenchymal echogenicity. Portal vein is patent on color Doppler imaging with normal direction of blood flow towards the liver. IVC: No abnormality visualized. Pancreas: Limited visualization. Spleen: Size and appearance within normal limits.  8.2 cm in length. Right Kidney: Length: 11.6 cm. Echogenicity within normal limits. No mass or hydronephrosis visualized. Left Kidney: Length: 11.9 cm. Echogenicity within normal limits. No mass or hydronephrosis visualized. Abdominal aorta: No aneurysm visualized.  Other findings: None. IMPRESSION: Findings are equivocal for acute cholecystitis. Extensive sludge and small stones within the gallbladder with positive Murphy's sign noted by the sonographer. No pericholecystic fluid or gallbladder wall thickening. Electronically Signed   By: Minerva Fester M.D.   On: 03/02/2023 23:11   DG Chest Port 1 View Result Date: 03/02/2023 CLINICAL DATA:  Vomiting for 3 weeks. Dehydration. High heart rate. Epigastric pain and chest pain EXAM: PORTABLE CHEST 1 VIEW COMPARISON:  03/29/2018 FINDINGS: The heart size and mediastinal contours are within normal limits. Both lungs are clear. The visualized skeletal structures are unremarkable. IMPRESSION: No active disease. Electronically Signed   By: Minerva Fester M.D.   On: 03/02/2023 21:08   US Fetal Nuchal Translucency Measurement Result Date: 03/02/2023 Table formatting from the original result was not included. Images from the original result were not included.  ..an Financial trader of Ultrasound Medicine Technical sales engineer) and nuchal translucency accredited practice [facility] [address] [place] [address]         Ordering Provider: Myna Hidalgo, DO                                                                                                                                                                                               NUCHAL TRANSLUCENCY FOR INTEGRATED SCREEN Toni Moss is in the office for nuchal translucency sonogram as part of an integrated screen. She is a 23 y.o. year old G1P0000 with Estimated Date of Delivery: 09/13/23 by LMP now at  [redacted]w[redacted]d weeks gestation. Thus far the pregnancy has been complicated by BMI 30-39 . GESTATION: SINGLETON FETAL ACTIVITY:          Heart rate                  The fetus is active. AMNIOTIC FLUID: The amniotic fluid volume is  normal visually PLACENTA LOCALIZATION:  anterior GRADE 0 CERVIX: Measures appears closed ADNEXA: The ovaries are  normal. GESTATIONAL AGE AND  BIOMETRICS: Gestational criteria: Estimated  Date of Delivery: 09/13/23 by LMP now at [redacted]w[redacted]d Previous Scans:1                      CROWN RUMP LENGTH           63.92 mm         12+6 weeks NUCHAL TRANSLUCENCY           1.7 mm         normal                                                                   AVERAGE EGA(BY THIS SCAN):  12+6 weeks The fetal nasal bone is identified.  TECHNICIAN COMMENTS: [place] 12+1 wks,measurements c/w dates,FHR 165 bpm,anterior placenta,normal ovaries,NB  present,NT 1.7 mm,CRL 63.92 mm The patient will have the first blood draw of her integrated screening today and the second draw in approximately 4 weeks. Amber Flora Lipps 03/02/2023 2:05 PM Clinical Impression and recommendations: I have reviewed the sonogram results above, combined with the patient's current clinical course, below are my impressions and any appropriate recommendations for management based on the sonographic findings. 1.  G1P0000  Estimated Date of Delivery: Estimated Date of Delivery: 09/13/23 by  LMP and confirmed by today's sonographic dating 2.  Normal fetal sonographic findings, specifically normal nuchal translucency and present fetal nasal bone Additionally the cranium, both choroid plexuses, zygomatic arch, mandible, 4 equal extremities, stomach, bladder and anterior abdomen are all noted. 3.  Normal general sonographic findings Recommend routine prenatal care based on this sonogram or as clinically indicated Toni Moss                                                    Review of Systems  Constitutional:  Negative for chills and fever.  HENT:  Negative for ear discharge, hearing loss and sore throat.   Eyes:  Negative for discharge.  Respiratory:  Negative for cough and shortness of breath.   Cardiovascular:  Negative for chest pain and leg swelling.  Gastrointestinal:  Positive for abdominal pain, nausea and vomiting. Negative for constipation and diarrhea.  Musculoskeletal:   Negative for myalgias and neck pain.  Skin:  Negative for rash.  Allergic/Immunologic: Negative for environmental allergies.  Neurological:  Negative for dizziness and seizures.  Hematological:  Does not bruise/bleed easily.  Psychiatric/Behavioral:  Negative for suicidal ideas.   All other systems reviewed and are negative.  Blood pressure 101/61, pulse (!) 114, temperature 97.8 F (36.6 C), temperature source Oral, resp. rate 16, height 5\' 5"  (1.651 m), weight 78.9 kg, last menstrual period 12/07/2022, SpO2 99%. Physical Exam Constitutional:      Appearance: She is well-developed.     Comments: Conversant No acute distress  HENT:     Head: Normocephalic and atraumatic.  Eyes:     General: Lids are normal. No scleral icterus.    Pupils: Pupils are equal, round, and reactive to light.     Comments: Pupils are equal round and reactive No lid lag Moist conjunctiva  Neck:     Thyroid: No thyromegaly.  Trachea: No tracheal tenderness.     Comments: No cervical lymphadenopathy Cardiovascular:     Rate and Rhythm: Normal rate and regular rhythm.     Heart sounds: No murmur heard. Pulmonary:     Effort: Pulmonary effort is normal.     Breath sounds: Normal breath sounds. No wheezing or rales.  Abdominal:     Tenderness: There is abdominal tenderness in the right upper quadrant and epigastric area.     Hernia: No hernia is present.  Musculoskeletal:     Cervical back: Normal range of motion and neck supple.  Skin:    General: Skin is warm.     Findings: No rash.     Nails: There is no clubbing.     Comments: Normal skin turgor  Neurological:     Mental Status: She is alert and oriented to person, place, and time.     Comments: Normal gait and station  Psychiatric:        Mood and Affect: Mood normal.        Thought Content: Thought content normal.        Judgment: Judgment normal.     Comments: Appropriate affect     Assessment/Plan: 23 year old female, 12 weeks  pregnancy Cholelithiasis  1.  At this point difficult to assess whether or not she is having any symptoms from her gallstones versus possible reflux.  On ultrasound there is no signs of cholecystitis.  She does have gallstones.  I discussed with her that hopefully try to get her through her delivery without surgery would be ideal.  I do believe at some point she will need her gallbladder removed.  If she did require surgery second trimester would be ideal for a lap chole.  I do not feel that she needs any urgent surgery currently. 2.  Patient okay for p.o. trial 3.  Will have follow-up contact in the chart in case she does have further symptoms, otherwise we can see her after her delivery to discuss lap chole at that time if she continues with any symptoms postdelivery.  Toni Moss 03/03/2023, 12:30 PM
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
## 2023-03-04 ENCOUNTER — Other Ambulatory Visit (HOSPITAL_COMMUNITY): Payer: Self-pay

## 2023-03-04 LAB — INTEGRATED 1
Crown Rump Length: 63.9 mm
Gest. Age on Collection Date: 12.6 wk
PAPP-A Value: 2794.7 ng/mL
Race: 1
Sonographer ID#: 20.6 a
Sonographer ID#: 309760
Weight: 1.7 mm
Weight: 174 [lb_av]

## 2023-03-04 LAB — COMPREHENSIVE METABOLIC PANEL
ALT: 102 U/L — ABNORMAL HIGH (ref 0–44)
AST: 49 U/L — ABNORMAL HIGH (ref 15–41)
Albumin: 2.3 g/dL — ABNORMAL LOW (ref 3.5–5.0)
Alkaline Phosphatase: 59 U/L (ref 38–126)
Anion gap: 11 (ref 5–15)
BUN: <5 mg/dL — ABNORMAL LOW (ref 6–20)
CO2: 23 mmol/L (ref 22–32)
Calcium: 8.8 mg/dL — ABNORMAL LOW (ref 8.9–10.3)
Chloride: 105 mmol/L (ref 98–111)
Creatinine, Ser: 0.51 mg/dL (ref 0.44–1.00)
GFR, Estimated: >60 mL/min (ref >60)
Glucose, Bld: 109 mg/dL — ABNORMAL HIGH (ref 70–99)
Potassium: 3.2 mmol/L — ABNORMAL LOW (ref 3.5–5.1)
Sodium: 139 mmol/L (ref 135–145)
Total Bilirubin: 1 mg/dL (ref 0.0–1.2)
Total Protein: 4.8 g/dL — ABNORMAL LOW (ref 6.5–8.1)

## 2023-03-04 LAB — CBC/D/PLT+RPR+RH+ABO+RUBIGG...
Antibody Screen: NEGATIVE
Basophils Absolute: 0 10*3/uL (ref 0.0–0.2)
Basos: 0 %
EOS (ABSOLUTE): 0 10*3/uL (ref 0.0–0.4)
Eos: 0 %
HCV Ab: NONREACTIVE
HIV Screen 4th Generation wRfx: NONREACTIVE
Hematocrit: 46.1 % (ref 34.0–46.6)
Hemoglobin: 15.5 g/dL (ref 11.1–15.9)
Hepatitis B Surface Ag: NEGATIVE
Immature Grans (Abs): 0.1 10*3/uL (ref 0.0–0.1)
Immature Granulocytes: 1 %
Lymphocytes Absolute: 2.1 10*3/uL (ref 0.7–3.1)
Lymphs: 11 %
MCH: 29.9 pg (ref 26.6–33.0)
MCHC: 33.6 g/dL (ref 31.5–35.7)
MCV: 89 fL (ref 79–97)
Monocytes Absolute: 1.3 10*3/uL — ABNORMAL HIGH (ref 0.1–0.9)
Monocytes: 7 %
Neutrophils Absolute: 15.4 10*3/uL — ABNORMAL HIGH (ref 1.4–7.0)
Neutrophils: 81 %
Platelets: 404 10*3/uL (ref 150–450)
RBC: 5.18 x10E6/uL (ref 3.77–5.28)
RDW: 13.6 % (ref 11.7–15.4)
RPR Ser Ql: NONREACTIVE
Rh Factor: NEGATIVE
Rubella Antibodies, IGG: <0.9 {index} — ABNORMAL LOW (ref >0.99)
WBC: 19 10*3/uL — ABNORMAL HIGH (ref 3.4–10.8)

## 2023-03-04 LAB — CBC
HCT: 29.6 % — ABNORMAL LOW (ref 36.0–46.0)
Hemoglobin: 10.3 g/dL — ABNORMAL LOW (ref 12.0–15.0)
MCH: 29.9 pg (ref 26.0–34.0)
MCHC: 34.8 g/dL (ref 30.0–36.0)
MCV: 85.8 fL (ref 80.0–100.0)
Platelets: 201 10*3/uL (ref 150–400)
RBC: 3.45 MIL/uL — ABNORMAL LOW (ref 3.87–5.11)
RDW: 13.6 % (ref 11.5–15.5)
WBC: 10.1 10*3/uL (ref 4.0–10.5)
nRBC: 0 % (ref 0.0–0.2)

## 2023-03-04 LAB — URINE CULTURE: Organism ID, Bacteria: NO GROWTH

## 2023-03-04 LAB — MAGNESIUM: Magnesium: 1.4 mg/dL — ABNORMAL LOW (ref 1.7–2.4)

## 2023-03-04 LAB — HCV INTERPRETATION

## 2023-03-04 LAB — HEMOGLOBIN A1C
Est. average glucose Bld gHb Est-mCnc: 97 mg/dL
Hgb A1c MFr Bld: 5 % (ref 4.8–5.6)

## 2023-03-04 MED ORDER — OMEPRAZOLE 20 MG PO CPDR
20.0000 mg | DELAYED_RELEASE_CAPSULE | Freq: Every day | ORAL | 6 refills | Status: DC
  Filled 2023-03-04: qty 30, 30d supply, fill #0

## 2023-03-04 MED ORDER — ONDANSETRON 8 MG PO TBDP
8.0000 mg | ORAL_TABLET | Freq: Three times a day (TID) | ORAL | 2 refills | Status: DC | PRN
  Filled 2023-03-04: qty 18, 6d supply, fill #0

## 2023-03-04 NOTE — Progress Notes (Signed)
BSUS done to confirm FHT - unable to be obtained by RN with doppler. Cardiac and fetal motion observed. Patient reassured.   Milas Hock, MD Attending Obstetrician & Gynecologist, [facility], [facility]

## 2023-03-04 NOTE — Progress Notes (Signed)
Discharge instructions and prescriptions given to pt. Discussed signs and symptoms to report to the MD, upcoming appointments, and meds. Pt has no questions or concerns at this time. Pt discharged home in stable condition.

## 2023-03-04 NOTE — Discharge Summary (Signed)
Physician Discharge Summary  Patient ID: Toni Moss MRN: [id number] DOB/AGE: 2000-02-07 23 y.o.  Admit date: 03/02/2023 Discharge date: 03/04/2023  Admission Diagnoses: Hyperemesis gravidarum with dehydration and hypokalemia Cholelithiasis with evidence of intermittent CBD involvement not obstructed  Discharge Diagnoses:  Principal Problem:   Hyperbilirubinemia   Discharged Condition: stable  Hospital Course: admitted with severe dehydration with hypokalemia Hyperbilirubinemia with GB stones and sludge Gen surg felt choley not indicated at present Discharged with emesis control and normalized potassium  Consults:   Significant Diagnostic Studies: labs: MRCP sonogram  Treatments: hydration nausea control  Discharge Exam: Blood pressure 112/75, pulse 100, temperature 98 F (36.7 C), temperature source Oral, resp. rate 17, height 5\' 5"  (1.651 m), weight 78.9 kg, last menstrual period 12/07/2022, SpO2 98%. General appearance: alert, cooperative, and no distress GI: soft, non-tender; bowel sounds normal; no masses,  no organomegaly  Disposition: Discharge disposition: 01-Home or Self Care       Discharge Instructions     Call MD for:  persistant nausea and vomiting   Complete by: As directed    Diet - low sodium heart healthy   Complete by: As directed    Increase activity slowly   Complete by: As directed       Allergies as of 03/04/2023       Reactions   Doxycycline Nausea And Vomiting   Penicillins Hives   Prozac [fluoxetine Hcl]         Medication List     TAKE these medications    lamoTRIgine 25 MG tablet Commonly known as: LaMICtal Take 1 tablet nightly for 2 weeks.  Then take 2 tablets nightly for 2 weeks.   lamoTRIgine 100 MG tablet Commonly known as: LaMICtal Take 1 tablet (100 mg total) by mouth at bedtime. After completing 50 mg nightly dose. Start taking on: March 09, 2023   omeprazole 20 MG capsule Commonly known as:  PRILOSEC Take 1 capsule (20 mg total) by mouth daily. 1 tablet a day   ondansetron 8 MG disintegrating tablet Commonly known as: ZOFRAN-ODT Take 1 tablet (8 mg total) by mouth every 8 (eight) hours as needed for nausea. What changed:  medication strength how much to take when to take this   PRENATAL VITAMIN PO Take by mouth.   promethazine 25 MG tablet Commonly known as: PHENERGAN Take 1 tablet (25 mg total) by mouth every 6 (six) hours as needed for nausea or vomiting.        Follow-up Information     [facility], [place]. Call.   Specialty: General Surgery Why: As needed for gallbladder symptoms Contact information: [address] [place] [address] [phone]        Lazaro Arms, MD Follow up on 03/08/2023.   Specialties: Obstetrics and Gynecology, Radiology Why: hopsital discharge visit Contact information: [address] [place] [address] [phone]                 Signed: Lazaro Arms 03/04/2023, 8:29 AM

## 2023-03-06 LAB — GC/CHLAMYDIA PROBE AMP
Chlamydia trachomatis, NAA: NEGATIVE
Neisseria Gonorrhoeae by PCR: NEGATIVE

## 2023-03-07 LAB — CULTURE, BLOOD (ROUTINE X 2)
Culture: NO GROWTH
Special Requests: ADEQUATE

## 2023-03-08 ENCOUNTER — Ambulatory Visit: Payer: Managed Care, Other (non HMO) | Admitting: Obstetrics & Gynecology

## 2023-03-08 ENCOUNTER — Encounter: Payer: Self-pay | Admitting: Obstetrics & Gynecology

## 2023-03-08 VITALS — BP 112/71 | HR 118 | Wt 188.0 lb

## 2023-03-08 DIAGNOSIS — O0991 Supervision of high risk pregnancy, unspecified, first trimester: Secondary | ICD-10-CM

## 2023-03-08 DIAGNOSIS — Z3A13 13 weeks gestation of pregnancy: Secondary | ICD-10-CM

## 2023-03-08 DIAGNOSIS — O26611 Liver and biliary tract disorders in pregnancy, first trimester: Secondary | ICD-10-CM

## 2023-03-08 DIAGNOSIS — Z3481 Encounter for supervision of other normal pregnancy, first trimester: Secondary | ICD-10-CM

## 2023-03-08 DIAGNOSIS — O26612 Liver and biliary tract disorders in pregnancy, second trimester: Secondary | ICD-10-CM

## 2023-03-08 LAB — POCT URINALYSIS DIPSTICK OB
Blood, UA: NEGATIVE
Glucose, UA: NEGATIVE
Ketones, UA: NEGATIVE
Leukocytes, UA: NEGATIVE
Nitrite, UA: NEGATIVE
POC,PROTEIN,UA: NEGATIVE

## 2023-03-08 MED ORDER — OMEPRAZOLE 20 MG PO CPDR
20.0000 mg | DELAYED_RELEASE_CAPSULE | Freq: Every day | ORAL | 6 refills | Status: DC
Start: 2023-03-08 — End: 2023-10-17

## 2023-03-08 MED ORDER — ONDANSETRON 8 MG PO TBDP: 8.0000 mg | ORAL_TABLET | Freq: Three times a day (TID) | ORAL | 2 refills | Status: DC | PRN

## 2023-03-08 NOTE — Progress Notes (Signed)
 LOW-RISK PREGNANCY VISIT Patient name: Toni Moss MRN [id number]  Date of birth: 2000/08/19 Chief Complaint:   Follow-up  History of Present Illness:   Toni Moss is a 23 y.o. G8P0000 female at [redacted]w[redacted]d with an Estimated Date of Delivery: 09/13/23 being seen today for ongoing management of a low-risk pregnancy.     03/02/2023    2:24 PM 12/20/2022    2:45 PM 12/20/2022    1:14 PM 11/04/2022    9:34 AM 10/28/2022    2:21 PM  Depression screen PHQ 2/9  Decreased Interest 2 1 3 3    Down, Depressed, Hopeless 2 1 3 3    PHQ - 2 Score 4 2 6 6    Altered sleeping 2 1 3 3    Tired, decreased energy 2 3 3 3    Change in appetite 2 2 3 3    Feeling bad or failure about yourself  2 0 3 3   Trouble concentrating 2 3 3 3    Moving slowly or fidgety/restless 2  3 3    Suicidal thoughts 0 0 0 0   PHQ-9 Score 16 11 24 24    Difficult doing work/chores  Very difficult  Extremely dIfficult      Information is confidential and restricted. Go to Review Flowsheets to unlock data.    Today she reports no complaints. Contractions: Not present. Vag. Bleeding: None.  Movement: Absent. denies leaking of fluid. Review of Systems:   Pertinent items are noted in HPI Denies abnormal vaginal discharge w/ itching/odor/irritation, headaches, visual changes, shortness of breath, chest pain, abdominal pain, severe nausea/vomiting, or problems with urination or bowel movements unless otherwise stated above. Pertinent History Reviewed:  Reviewed past medical,surgical, social, obstetrical and family history.  Reviewed problem list, medications and allergies. Physical Assessment:   Vitals:   03/08/23 0851  BP: 112/71  Pulse: (!) 118  Weight: 188 lb (85.3 kg)  Body mass index is 31.28 kg/m.        Physical Examination:   General appearance: Well appearing, and in no distress  Mental status: Alert, oriented to person, place, and time  Skin: Warm & dry  Cardiovascular: Normal heart rate noted  Respiratory: Normal  respiratory effort, no distress  Abdomen: Soft, gravid, nontender  Pelvic: Cervical exam deferred         Extremities:    Fetal Status: Fetal Heart Rate (bpm): 155   Movement: Absent    Chaperone: n/a    Results for orders placed or performed in visit on 03/08/23 (from the past 24 hours)  POC Urinalysis Dipstick OB   Collection Time: 03/08/23  8:56 AM  Result Value Ref Range   Color, UA     Clarity, UA     Glucose, UA Negative Negative   Bilirubin, UA     Ketones, UA neg    Spec Grav, UA     Blood, UA neg    pH, UA     POC,PROTEIN,UA Negative Negative, Trace, Small (1+), Moderate (2+), Large (3+), 4+   Urobilinogen, UA     Nitrite, UA neg    Leukocytes, UA Negative Negative   Appearance     Odor      Assessment & Plan:  1) Low-risk pregnancy G1P0000 at [redacted]w[redacted]d with an Estimated Date of Delivery: 09/13/23   2) Cholelithiasis to see Dr Lovell Sheehan 03/29/23 and top have GB removed the following week,    Meds:  Meds ordered this encounter  Medications   ondansetron (ZOFRAN-ODT) 8 MG disintegrating tablet  Sig: Take 1 tablet (8 mg total) by mouth every 8 (eight) hours as needed for nausea.    Dispense:  30 tablet    Refill:  2   omeprazole (PRILOSEC) 20 MG capsule    Sig: Take 1 capsule (20 mg total) by mouth daily.    Dispense:  30 capsule    Refill:  6   Labs/procedures today:   Plan:  Continue routine obstetrical care  Next visit: prefers in person      Follow-up: Return in about 4 weeks (around 04/05/2023) for LROB.  Orders Placed This Encounter  Procedures   Ambulatory referral to General Surgery   POC Urinalysis Dipstick OB    Lazaro Arms, MD 03/08/2023 9:19 AM

## 2023-03-10 ENCOUNTER — Telehealth: Payer: Self-pay | Admitting: Obstetrics & Gynecology

## 2023-03-10 LAB — PANORAMA PRENATAL TEST FULL PANEL:PANORAMA TEST PLUS 5 ADDITIONAL MICRODELETIONS: FETAL FRACTION: 3.3

## 2023-03-10 NOTE — Telephone Encounter (Signed)
 Patient calling concerning the rx Zofran. Pharmacy won't fill it. Please advise.

## 2023-03-11 ENCOUNTER — Encounter: Payer: Self-pay | Admitting: Advanced Practice Midwife

## 2023-03-11 DIAGNOSIS — K8020 Calculus of gallbladder without cholecystitis without obstruction: Secondary | ICD-10-CM | POA: Insufficient documentation

## 2023-03-15 LAB — HORIZON CUSTOM: REPORT SUMMARY: NEGATIVE

## 2023-03-16 ENCOUNTER — Ambulatory Visit: Payer: Managed Care, Other (non HMO) | Admitting: Advanced Practice Midwife

## 2023-03-16 ENCOUNTER — Encounter: Payer: Self-pay | Admitting: Advanced Practice Midwife

## 2023-03-16 VITALS — BP 118/82 | HR 150 | Wt 187.0 lb

## 2023-03-16 DIAGNOSIS — O0992 Supervision of high risk pregnancy, unspecified, second trimester: Secondary | ICD-10-CM | POA: Diagnosis not present

## 2023-03-16 DIAGNOSIS — O26892 Other specified pregnancy related conditions, second trimester: Secondary | ICD-10-CM

## 2023-03-16 DIAGNOSIS — O26899 Other specified pregnancy related conditions, unspecified trimester: Secondary | ICD-10-CM | POA: Insufficient documentation

## 2023-03-16 DIAGNOSIS — Z3A14 14 weeks gestation of pregnancy: Secondary | ICD-10-CM | POA: Diagnosis not present

## 2023-03-16 DIAGNOSIS — R300 Dysuria: Secondary | ICD-10-CM

## 2023-03-16 DIAGNOSIS — Z6791 Unspecified blood type, Rh negative: Secondary | ICD-10-CM

## 2023-03-16 DIAGNOSIS — Z3402 Encounter for supervision of normal first pregnancy, second trimester: Secondary | ICD-10-CM

## 2023-03-16 LAB — POCT URINALYSIS DIPSTICK OB
Glucose, UA: NEGATIVE
Leukocytes, UA: NEGATIVE
Nitrite, UA: NEGATIVE

## 2023-03-16 NOTE — Patient Instructions (Signed)
Toni Moss, thank you for choosing our office today! We appreciate the opportunity to meet your healthcare needs. You may receive a short survey by mail, e-mail, or through [employer]. If you are happy with your care we would appreciate if you could take just a few minutes to complete the survey questions. We read all of your comments and take your feedback very seriously. Thank you again for choosing our office.  Center for [employer] Team at [facility] at [facility] ([address] [place], [place] [address]) Entrance C, located off of [address] Free 24/7 valet parking  Go to [email or web address] to register for FREE online childbirth classes  Call the office [phone]) or go to [facility] if: You begin to severe cramping Your water breaks.  Sometimes it is a big gush of fluid, sometimes it is just a trickle that keeps getting your panties wet or running down your legs You have vaginal bleeding.  It is normal to have a small amount of spotting if your cervix was checked.   [facility] Pediatricians/Family Doctors [facility]): [address]. Colette Ribas, [phone]           [facility]: [address], [phone]                [facility] Medicine [facility]): [address], [phone] (call to ask if accepting patients) [facility] Department: [address], [place], [phone]    [facility] Pediatricians/Family Doctors [facility]): [phone] [address], [phone] [facility]: [address] [place], [phone] [facility] [place]: [address], [phone]  [facility] Doctors  [facility]): [phone] [facility]: [address] [phone]   [facility] Doctors [facility]: [address], [phone]  [facility] Doctors  [facility]: [phone], [phone]  Home Blood Pressure Monitoring for Patients   Your provider has recommended that you check your blood pressure (BP) at least once a week at home. If you do not have a blood pressure cuff at home, one will be provided for you. Contact your provider if you have not received your monitor within 1 week.   Helpful Tips for Accurate Home Blood Pressure Checks  Don't smoke, exercise, or drink caffeine 30 minutes before checking your BP Use the restroom before checking your BP (a full bladder can raise your pressure) Relax in a comfortable upright chair Feet on the ground Left arm resting comfortably on a flat surface at the level of your heart Legs uncrossed Back supported Sit quietly and don't talk Place the cuff on your bare arm Adjust snuggly, so that only two fingertips can fit between your skin and the top of the cuff Check 2 readings separated by at least one minute Keep a log of your BP readings For a visual, please reference this diagram: http://ccnc.care/bpdiagram  Provider Name: [facility]     Phone: [phone]  Zone 1: ALL CLEAR  Continue to monitor your symptoms:  BP reading is less than 140 (top number) or less than 90 (bottom number)  No right upper stomach pain No headaches or seeing spots No feeling nauseated or throwing up No swelling in face and hands  Zone 2: CAUTION Call your doctor's office for any of the following:  BP reading is greater than 140 (top number) or greater than  90 (bottom number)  Stomach pain under your ribs in the middle or right side Headaches or seeing spots Feeling nauseated or throwing up Swelling in face and hands  Zone 3: EMERGENCY  Seek immediate medical care if you have any of the following:  BP reading is greater than160 (top number) or greater than 110 (bottom number) Severe headaches not improving with Tylenol Serious difficulty catching your breath Any worsening symptoms from  Zone 2     Second Trimester of Pregnancy The second trimester is from week 14 through week 27 (months 4 through 6). The second trimester is often a time when you feel your best. Your body has adjusted to being pregnant, and you begin to feel better physically. Usually, morning sickness has lessened or quit completely, you may have more energy, and you may have an increase in appetite. The second trimester is also a time when the fetus is growing rapidly. At the end of the sixth month, the fetus is about 9 inches long and weighs about 1 pounds. You will likely begin to feel the baby move (quickening) between 16 and 20 weeks of pregnancy. Body changes during your second trimester Your body continues to go through many changes during your second trimester. The changes vary from woman to woman. Your weight will continue to increase. You will notice your lower abdomen bulging out. You may begin to get stretch marks on your hips, abdomen, and breasts. You may develop headaches that can be relieved by medicines. The medicines should be approved by your health care provider. You may urinate more often because the fetus is pressing on your bladder. You may develop or continue to have heartburn as a result of your pregnancy. You may develop constipation because certain hormones are causing the muscles that push waste through your intestines to slow down. You may develop hemorrhoids or swollen, bulging veins (varicose veins). You may have back pain. This is caused by: Weight gain. Pregnancy hormones that are relaxing the joints in your pelvis. A shift in weight and the muscles that support your balance. Your breasts will continue to grow and they will continue to become tender. Your gums may bleed and may be sensitive to brushing and flossing. Dark spots or blotches (chloasma, mask of pregnancy) may develop on your face. This will likely fade after the baby is born. A dark line from your belly button to  the pubic area (linea nigra) may appear. This will likely fade after the baby is born. You may have changes in your hair. These can include thickening of your hair, rapid growth, and changes in texture. Some women also have hair loss during or after pregnancy, or hair that feels dry or thin. Your hair will most likely return to normal after your baby is born.  What to expect at prenatal visits During a routine prenatal visit: You will be weighed to make sure you and the fetus are growing normally. Your blood pressure will be taken. Your abdomen will be measured to track your baby's growth. The fetal heartbeat will be listened to. Any test results from the previous visit will be discussed.  Your health care provider may ask you: How you are feeling. If you are feeling the baby move. If you have had any abnormal symptoms, such as leaking fluid, bleeding, severe headaches, or abdominal cramping. If you are using any tobacco products, including cigarettes, chewing tobacco, and electronic cigarettes. If you have any questions.  Other tests that may be performed during   your second trimester include: Blood tests that check for: Low iron levels (anemia). High blood sugar that affects pregnant women (gestational diabetes) between 24 and 28 weeks. Rh antibodies. This is to check for a protein on red blood cells (Rh factor). Urine tests to check for infections, diabetes, or protein in the urine. An ultrasound to confirm the proper growth and development of the baby. An amniocentesis to check for possible genetic problems. Fetal screens for spina bifida and Down syndrome. HIV (human immunodeficiency virus) testing. Routine prenatal testing includes screening for HIV, unless you choose not to have this test.  Follow these instructions at home: Medicines Follow your health care provider's instructions regarding medicine use. Specific medicines may be either safe or unsafe to take during  pregnancy. Take a prenatal vitamin that contains at least 600 micrograms (mcg) of folic acid. If you develop constipation, try taking a stool softener if your health care provider approves. Eating and drinking Eat a balanced diet that includes fresh fruits and vegetables, whole grains, good sources of protein such as meat, eggs, or tofu, and low-fat dairy. Your health care provider will help you determine the amount of weight gain that is right for you. Avoid raw meat and uncooked cheese. These carry germs that can cause birth defects in the baby. If you have low calcium intake from food, talk to your health care provider about whether you should take a daily calcium supplement. Limit foods that are high in fat and processed sugars, such as fried and sweet foods. To prevent constipation: Drink enough fluid to keep your urine clear or pale yellow. Eat foods that are high in fiber, such as fresh fruits and vegetables, whole grains, and beans. Activity Exercise only as directed by your health care provider. Most women can continue their usual exercise routine during pregnancy. Try to exercise for 30 minutes at least 5 days a week. Stop exercising if you experience uterine contractions. Avoid heavy lifting, wear low heel shoes, and practice good posture. A sexual relationship may be continued unless your health care provider directs you otherwise. Relieving pain and discomfort Wear a good support bra to prevent discomfort from breast tenderness. Take warm sitz baths to soothe any pain or discomfort caused by hemorrhoids. Use hemorrhoid cream if your health care provider approves. Rest with your legs elevated if you have leg cramps or low back pain. If you develop varicose veins, wear support hose. Elevate your feet for 15 minutes, 3-4 times a day. Limit salt in your diet. Prenatal Care Write down your questions. Take them to your prenatal visits. Keep all your prenatal visits as told by your health  care provider. This is important. Safety Wear your seat belt at all times when driving. Make a list of emergency phone numbers, including numbers for family, friends, the hospital, and police and fire departments. General instructions Ask your health care provider for a referral to a local prenatal education class. Begin classes no later than the beginning of month 6 of your pregnancy. Ask for help if you have counseling or nutritional needs during pregnancy. Your health care provider can offer advice or refer you to specialists for help with various needs. Do not use hot tubs, steam rooms, or saunas. Do not douche or use tampons or scented sanitary pads. Do not cross your legs for long periods of time. Avoid cat litter boxes and soil used by cats. These carry germs that can cause birth defects in the baby and possibly loss of the   fetus by miscarriage or stillbirth. Avoid all smoking, herbs, alcohol, and unprescribed drugs. Chemicals in these products can affect the formation and growth of the baby. Do not use any products that contain nicotine or tobacco, such as cigarettes and e-cigarettes. If you need help quitting, ask your health care provider. Visit your dentist if you have not gone yet during your pregnancy. Use a soft toothbrush to brush your teeth and be gentle when you floss. Contact a health care provider if: You have dizziness. You have mild pelvic cramps, pelvic pressure, or nagging pain in the abdominal area. You have persistent nausea, vomiting, or diarrhea. You have a bad smelling vaginal discharge. You have pain when you urinate. Get help right away if: You have a fever. You are leaking fluid from your vagina. You have spotting or bleeding from your vagina. You have severe abdominal cramping or pain. You have rapid weight gain or weight loss. You have shortness of breath with chest pain. You notice sudden or extreme swelling of your face, hands, ankles, feet, or legs. You  have not felt your baby move in over an hour. You have severe headaches that do not go away when you take medicine. You have vision changes. Summary The second trimester is from week 14 through week 27 (months 4 through 6). It is also a time when the fetus is growing rapidly. Your body goes through many changes during pregnancy. The changes vary from woman to woman. Avoid all smoking, herbs, alcohol, and unprescribed drugs. These chemicals affect the formation and growth your baby. Do not use any tobacco products, such as cigarettes, chewing tobacco, and e-cigarettes. If you need help quitting, ask your health care provider. Contact your health care provider if you have any questions. Keep all prenatal visits as told by your health care provider. This is important. This information is not intended to replace advice given to you by your health care provider. Make sure you discuss any questions you have with your health care provider. Document Released: 12/29/2000 Document Revised: 06/12/2015 Document Reviewed: 03/07/2012 Elsevier Interactive Patient Education  2017 Elsevier Inc.  

## 2023-03-16 NOTE — Progress Notes (Signed)
   LOW-RISK PREGNANCY VISIT Patient name: Toni Moss MRN [id number]  Date of birth: Jan 06, 2001 Chief Complaint:   Routine Prenatal Visit  History of Present Illness:   Toni Moss is a 23 y.o. G80P0000 female at [redacted]w[redacted]d with an Estimated Date of Delivery: 09/13/23 being seen today for ongoing management of a low-risk pregnancy.  Today she reports  doing better with N/V as long as taking Zofran and Prilosec; also notes bilat tingling of inner calves; dysuria . Contractions: Not present. Vag. Bleeding: None.  Movement: Absent. denies leaking of fluid. Review of Systems:   Pertinent items are noted in HPI Denies abnormal vaginal discharge w/ itching/odor/irritation, headaches, visual changes, shortness of breath, chest pain, abdominal pain, severe nausea/vomiting, or problems with urination or bowel movements unless otherwise stated above. Pertinent History Reviewed:  Reviewed past medical,surgical, social, obstetrical and family history.  Reviewed problem list, medications and allergies. Physical Assessment:   Vitals:   03/16/23 1453  BP: 118/82  Pulse: (!) 150  Weight: 187 lb (84.8 kg)  Body mass index is 31.12 kg/m.        Physical Examination:   General appearance: Well appearing, and in no distress  Mental status: Alert, oriented to person, place, and time  Skin: Warm & dry  Cardiovascular: Normal heart rate noted  Respiratory: Normal respiratory effort, no distress  Abdomen: Soft, gravid, nontender  Pelvic: Cervical exam deferred         Extremities:    Fetal Status: Fetal Heart Rate (bpm): 153   Movement: Absent    Results for orders placed or performed in visit on 03/16/23 (from the past 24 hours)  POC Urinalysis Dipstick OB   Collection Time: 03/16/23  2:55 PM  Result Value Ref Range   Color, UA     Clarity, UA     Glucose, UA Negative Negative   Bilirubin, UA     Ketones, UA small    Spec Grav, UA     Blood, UA small    pH, UA     POC,PROTEIN,UA Moderate (2+)  Negative, Trace, Small (1+), Moderate (2+), Large (3+), 4+   Urobilinogen, UA     Nitrite, UA neg    Leukocytes, UA Negative Negative   Appearance     Odor      Assessment & Plan:  1) Low-risk pregnancy G1P0000 at [redacted]w[redacted]d with an Estimated Date of Delivery: 09/13/23   2) Dysuria, UA neg for infection (neg culture x 2wks ago); changed toilet paper recently so thinks it may be from that  3) Cholelithiasis, symptoms much improved; sees Dr Lovell Sheehan to sched surgery on 3/11  4) Rh neg, Rhogam ~28wks   Meds: No orders of the defined types were placed in this encounter.  Labs/procedures today: none  Plan:  Continue routine obstetrical care   Reviewed: Preterm labor symptoms and general obstetric precautions including but not limited to vaginal bleeding, contractions, leaking of fluid and fetal movement were reviewed in detail with the patient.  All questions were answered. Didn't ask about home bp cuff. Check bp weekly, let us know if >140/90.   Follow-up: Return for As scheduled.  Orders Placed This Encounter  Procedures   POC Urinalysis Dipstick OB   Arabella Merles [facility] 03/16/2023 3:17 PM

## 2023-03-19 DIAGNOSIS — Z419 Encounter for procedure for purposes other than remedying health state, unspecified: Secondary | ICD-10-CM | POA: Diagnosis not present

## 2023-03-19 HISTORY — PX: COLONOSCOPY: SHX174

## 2023-03-21 ENCOUNTER — Encounter (HOSPITAL_COMMUNITY): Payer: Self-pay | Admitting: Psychiatry

## 2023-03-21 ENCOUNTER — Telehealth (INDEPENDENT_AMBULATORY_CARE_PROVIDER_SITE_OTHER): Payer: 59 | Admitting: Psychiatry

## 2023-03-21 DIAGNOSIS — F330 Major depressive disorder, recurrent, mild: Secondary | ICD-10-CM

## 2023-03-21 DIAGNOSIS — F603 Borderline personality disorder: Secondary | ICD-10-CM | POA: Diagnosis not present

## 2023-03-21 DIAGNOSIS — F4001 Agoraphobia with panic disorder: Secondary | ICD-10-CM

## 2023-03-21 DIAGNOSIS — F411 Generalized anxiety disorder: Secondary | ICD-10-CM

## 2023-03-21 DIAGNOSIS — F4310 Post-traumatic stress disorder, unspecified: Secondary | ICD-10-CM | POA: Diagnosis not present

## 2023-03-21 MED ORDER — LAMOTRIGINE 25 MG PO TABS: 25.0000 mg | ORAL_TABLET | Freq: Every evening | ORAL | 0 refills | Status: DC

## 2023-03-21 NOTE — Patient Instructions (Signed)
 We maintained the dose of lamotrigine (Lamictal) at 25 mg nightly.  Good luck with the surgery.
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
## 2023-03-29 ENCOUNTER — Ambulatory Visit (INDEPENDENT_AMBULATORY_CARE_PROVIDER_SITE_OTHER): Payer: Managed Care, Other (non HMO) | Admitting: General Surgery

## 2023-03-29 ENCOUNTER — Encounter: Payer: Self-pay | Admitting: General Surgery

## 2023-03-29 VITALS — BP 98/75 | HR 133 | Temp 98.5°F | Resp 14 | Ht 65.0 in | Wt 184.0 lb

## 2023-03-29 DIAGNOSIS — E041 Nontoxic single thyroid nodule: Secondary | ICD-10-CM

## 2023-03-29 DIAGNOSIS — K8020 Calculus of gallbladder without cholecystitis without obstruction: Secondary | ICD-10-CM | POA: Diagnosis not present

## 2023-03-29 NOTE — H&P (Signed)
 LIEN LYMAN; [id number]; 05/11/2000   HPI Patient is a 23 year old white female gravida 1 para 0 at [redacted] weeks gestation who was referred to my care by Dr. Despina Hidden of obstetrics gynecology for cholelithiasis and biliary colic.  Patient is having constant nausea, occasional vomiting, and right upper quadrant abdominal pain.  She did have LFT elevation recently but an MRCP was negative for choledocholithiasis.  Her symptoms are controlled with Zofran.  She has not had episodes of emesis over the past few weeks, but continues to have a decreased appetite due to the concern for emesis.  She denies any fever, chills, or jaundice. In addition, she has had a sinus tachycardia of unknown etiology.  She did have a TSH drawn last fall which was low normal.  She denies any history of Graves' disease or thyroid disease.  Her resting heart rate is usually in the 1 20-1 30 range. 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   Chlamydia 02/25/2020   Treated 02/24/20 at Urgent Care with doxy, POC___________   Depression    IBS (irritable bowel syndrome)    Panic disorder with agoraphobia    Personality disorder (HCC)    PTSD (post-traumatic stress disorder)    Tic disorder     Past Surgical History:  Procedure Laterality Date   TYMPANOSTOMY TUBE PLACEMENT      Family History  Problem Relation Age of Onset   Depression Mother    Colon polyps Mother    Tourette syndrome Sister    Colon polyps Maternal Aunt    Alcohol abuse Maternal Uncle    Depression Maternal Grandmother    Colon polyps Maternal Grandmother    Lung cancer Paternal Grandmother    Tics Cousin    Bipolar disorder Other    Colon cancer Neg Hx    Esophageal cancer Neg Hx    Rectal cancer Neg Hx    Stomach cancer Neg Hx     Current Outpatient Medications on File Prior to Visit  Medication Sig  Dispense Refill   lamoTRIgine (LAMICTAL) 25 MG tablet Take 1 tablet (25 mg total) by mouth at bedtime. 90 tablet 0   omeprazole (PRILOSEC) 20 MG capsule Take 1 capsule (20 mg total) by mouth daily. 30 capsule 6   ondansetron (ZOFRAN-ODT) 8 MG disintegrating tablet Take 1 tablet (8 mg total) by mouth every 8 (eight) hours as needed for nausea. 30 tablet 2   Potassium 99 MG TABS Take by mouth.     Prenatal Vit-Fe Fumarate-FA (PRENATAL VITAMIN PO) Take by mouth.     [DISCONTINUED] sucralfate (CARAFATE) 1 GM/10ML suspension Take 10 mLs (1 g total) by mouth 4 (four) times daily -  with meals and at bedtime. (Patient not taking: Reported on 01/07/2023) 200 mL 0   No current facility-administered medications on file prior to visit.    Allergies  Allergen Reactions   Doxycycline Nausea And Vomiting   Penicillins Hives   Prozac [Fluoxetine Hcl]     Social History   Substance and Sexual Activity  Alcohol Use No    Social History   Tobacco Use  Smoking Status Never  Smokeless Tobacco Never    Review of Systems  Constitutional:  Positive for malaise/fatigue.  HENT:  Positive for ear pain and sinus pain.   Eyes: Negative.   Respiratory: Negative.    Cardiovascular:  Negative.   Gastrointestinal:  Positive for abdominal pain, heartburn, nausea and vomiting.  Genitourinary: Negative.   Musculoskeletal: Negative.   Neurological:  Positive for weakness.  Endo/Heme/Allergies: Negative.   Psychiatric/Behavioral: Negative.      Objective   Vitals:   03/29/23 1435  BP: 98/75  Pulse: (!) 133  Resp: 14  Temp: 98.5 F (36.9 C)  SpO2: 96%    Physical Exam Vitals reviewed.  Constitutional:      Appearance: Normal appearance. She is normal weight. She is not ill-appearing.  HENT:     Head: Normocephalic and atraumatic.  Neck:     Comments: I did palpate a solitary left thyroid nodule. Cardiovascular:     Rate and Rhythm: Tachycardia present.     Heart sounds: Normal heart  sounds. No murmur heard.    No friction rub. No gallop.  Pulmonary:     Effort: Pulmonary effort is normal. No respiratory distress.     Breath sounds: Normal breath sounds. No stridor. No wheezing, rhonchi or rales.  Abdominal:     General: Bowel sounds are normal. There is no distension.     Palpations: Abdomen is soft. There is no mass.     Tenderness: There is no abdominal tenderness. There is no guarding or rebound.     Hernia: No hernia is present.     Comments: Minimal discomfort to deep palpation in the right upper quadrant.  Fundal height is approximately 2 to 3 cm below the umbilicus.  Musculoskeletal:     Cervical back: Normal range of motion and neck supple.  Skin:    General: Skin is warm and dry.  Neurological:     Mental Status: She is alert and oriented to person, place, and time.     Assessment  Biliary colic secondary to cholelithiasis, [redacted] weeks gestation, sinus tachycardia of unknown etiology, thyroid nodule Plan  Given the fact that the patient has ongoing biliary colic and chronic nausea, we will proceed with a robotic assisted laparoscopic cholecystectomy during her second trimester on 04/08/2023.  Her sinus tachycardia seems to be stable.  The thyroid nodule may be an incidental finding and she has never had any symptoms of a thyroid storm.  I do recommend an ultrasound and thyroid panel as an outpatient.  The risks and benefits of the procedure including bleeding, infection, hepatobiliary injury, the possibility of an open procedure, and the possibility of miscarriage were fully explained to the patient, who gave informed consent.

## 2023-03-29 NOTE — Progress Notes (Signed)
 Toni Moss; [id number]; 05/11/2000   HPI Patient is a 23 year old white female gravida 1 para 0 at [redacted] weeks gestation who was referred to my care by Dr. Despina Hidden of obstetrics gynecology for cholelithiasis and biliary colic.  Patient is having constant nausea, occasional vomiting, and right upper quadrant abdominal pain.  She did have LFT elevation recently but an MRCP was negative for choledocholithiasis.  Her symptoms are controlled with Zofran.  She has not had episodes of emesis over the past few weeks, but continues to have a decreased appetite due to the concern for emesis.  She denies any fever, chills, or jaundice. In addition, she has had a sinus tachycardia of unknown etiology.  She did have a TSH drawn last fall which was low normal.  She denies any history of Graves' disease or thyroid disease.  Her resting heart rate is usually in the 1 20-1 30 range. 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   Chlamydia 02/25/2020   Treated 02/24/20 at Urgent Care with doxy, POC___________   Depression    IBS (irritable bowel syndrome)    Panic disorder with agoraphobia    Personality disorder (HCC)    PTSD (post-traumatic stress disorder)    Tic disorder     Past Surgical History:  Procedure Laterality Date   TYMPANOSTOMY TUBE PLACEMENT      Family History  Problem Relation Age of Onset   Depression Mother    Colon polyps Mother    Tourette syndrome Sister    Colon polyps Maternal Aunt    Alcohol abuse Maternal Uncle    Depression Maternal Grandmother    Colon polyps Maternal Grandmother    Lung cancer Paternal Grandmother    Tics Cousin    Bipolar disorder Other    Colon cancer Neg Hx    Esophageal cancer Neg Hx    Rectal cancer Neg Hx    Stomach cancer Neg Hx     Current Outpatient Medications on File Prior to Visit  Medication Sig  Dispense Refill   lamoTRIgine (LAMICTAL) 25 MG tablet Take 1 tablet (25 mg total) by mouth at bedtime. 90 tablet 0   omeprazole (PRILOSEC) 20 MG capsule Take 1 capsule (20 mg total) by mouth daily. 30 capsule 6   ondansetron (ZOFRAN-ODT) 8 MG disintegrating tablet Take 1 tablet (8 mg total) by mouth every 8 (eight) hours as needed for nausea. 30 tablet 2   Potassium 99 MG TABS Take by mouth.     Prenatal Vit-Fe Fumarate-FA (PRENATAL VITAMIN PO) Take by mouth.     [DISCONTINUED] sucralfate (CARAFATE) 1 GM/10ML suspension Take 10 mLs (1 g total) by mouth 4 (four) times daily -  with meals and at bedtime. (Patient not taking: Reported on 01/07/2023) 200 mL 0   No current facility-administered medications on file prior to visit.    Allergies  Allergen Reactions   Doxycycline Nausea And Vomiting   Penicillins Hives   Prozac [Fluoxetine Hcl]     Social History   Substance and Sexual Activity  Alcohol Use No    Social History   Tobacco Use  Smoking Status Never  Smokeless Tobacco Never    Review of Systems  Constitutional:  Positive for malaise/fatigue.  HENT:  Positive for ear pain and sinus pain.   Eyes: Negative.   Respiratory: Negative.    Cardiovascular:  Negative.   Gastrointestinal:  Positive for abdominal pain, heartburn, nausea and vomiting.  Genitourinary: Negative.   Musculoskeletal: Negative.   Neurological:  Positive for weakness.  Endo/Heme/Allergies: Negative.   Psychiatric/Behavioral: Negative.      Objective   Vitals:   03/29/23 1435  BP: 98/75  Pulse: (!) 133  Resp: 14  Temp: 98.5 F (36.9 C)  SpO2: 96%    Physical Exam Vitals reviewed.  Constitutional:      Appearance: Normal appearance. She is normal weight. She is not ill-appearing.  HENT:     Head: Normocephalic and atraumatic.  Neck:     Comments: I did palpate a solitary left thyroid nodule. Cardiovascular:     Rate and Rhythm: Tachycardia present.     Heart sounds: Normal heart  sounds. No murmur heard.    No friction rub. No gallop.  Pulmonary:     Effort: Pulmonary effort is normal. No respiratory distress.     Breath sounds: Normal breath sounds. No stridor. No wheezing, rhonchi or rales.  Abdominal:     General: Bowel sounds are normal. There is no distension.     Palpations: Abdomen is soft. There is no mass.     Tenderness: There is no abdominal tenderness. There is no guarding or rebound.     Hernia: No hernia is present.     Comments: Minimal discomfort to deep palpation in the right upper quadrant.  Fundal height is approximately 2 to 3 cm below the umbilicus.  Musculoskeletal:     Cervical back: Normal range of motion and neck supple.  Skin:    General: Skin is warm and dry.  Neurological:     Mental Status: She is alert and oriented to person, place, and time.     Assessment  Biliary colic secondary to cholelithiasis, [redacted] weeks gestation, sinus tachycardia of unknown etiology, thyroid nodule Plan  Given the fact that the patient has ongoing biliary colic and chronic nausea, we will proceed with a robotic assisted laparoscopic cholecystectomy during her second trimester on 04/08/2023.  Her sinus tachycardia seems to be stable.  The thyroid nodule may be an incidental finding and she has never had any symptoms of a thyroid storm.  I do recommend an ultrasound and thyroid panel as an outpatient.  The risks and benefits of the procedure including bleeding, infection, hepatobiliary injury, the possibility of an open procedure, and the possibility of miscarriage were fully explained to the patient, who gave informed consent.

## 2023-03-30 ENCOUNTER — Ambulatory Visit: Admitting: Physician Assistant

## 2023-03-30 ENCOUNTER — Encounter: Payer: Managed Care, Other (non HMO) | Admitting: Women's Health

## 2023-03-30 VITALS — BP 109/78 | HR 110 | Ht 65.0 in | Wt 187.0 lb

## 2023-03-30 DIAGNOSIS — R000 Tachycardia, unspecified: Secondary | ICD-10-CM | POA: Diagnosis not present

## 2023-03-30 DIAGNOSIS — H60313 Diffuse otitis externa, bilateral: Secondary | ICD-10-CM | POA: Diagnosis not present

## 2023-03-30 MED ORDER — CIPROFLOXACIN-DEXAMETHASONE 0.3-0.1 % OT SUSP
4.0000 [drp] | Freq: Two times a day (BID) | OTIC | 0 refills | Status: DC
Start: 2023-03-30 — End: 2023-04-21

## 2023-03-30 NOTE — Progress Notes (Signed)
 Acute Office Visit  Subjective:     Patient ID: Toni Moss, female    DOB: [birth year], 23 y.o.   MRN: [id number]   HPI Patient is in today for concerns of high heart rate.  She states upcoming cholecystectomy later this month, however due to persistently elevated heart rate patient referred for further evaluation before surgery occurs.  She states history of elevated heart rate for many years.  She denies symptomatology such as chest pain, shortness of breath, light headedness, dyspnea on exertion, palpitations.  At a recent preop appointment a small thyroid nodule was palpated on the left side.  The patient reports no symptoms of thyroid disease today.  She is not concerned with high heart rate.  She also reports bilateral ear pain.  She states this began about a week ago with her right ear however now involves both ears.  She admits to some drainage from her right ear.  She endorses some nasal drainage but denies fevers, fatigue, congestion, cough.  Patient has not taken anything at home for symptoms.  Patient denies sick contacts.   Review of Systems  Constitutional:  Negative for chills, fever and malaise/fatigue.  HENT:  Positive for ear discharge and ear pain. Negative for congestion, hearing loss and tinnitus.   Respiratory:  Negative for cough and shortness of breath.   Cardiovascular:  Negative for chest pain and palpitations.        Objective:     BP 109/78   Pulse (!) 110   Ht 5\' 5"  (1.651 m)   Wt 187 lb (84.8 kg)   LMP 12/07/2022 (Exact Date)   SpO2 97%   BMI 31.12 kg/m   Physical Exam Constitutional:      Appearance: Normal appearance.  HENT:     Head: Normocephalic.     Right Ear: Hearing and tympanic membrane normal. Drainage present.     Left Ear: Hearing and tympanic membrane normal. Drainage present.     Mouth/Throat:     Mouth: Mucous membranes are moist.     Pharynx: Oropharynx is clear.  Eyes:     Extraocular Movements: Extraocular movements  intact.     Conjunctiva/sclera: Conjunctivae normal.  Cardiovascular:     Rate and Rhythm: Regular rhythm. Tachycardia present.     Heart sounds: Normal heart sounds. No murmur heard. Pulmonary:     Effort: Pulmonary effort is normal.     Breath sounds: Normal breath sounds.  Skin:    General: Skin is warm and dry.  Neurological:     General: No focal deficit present.     Mental Status: She is alert and oriented to person, place, and time.  Psychiatric:        Mood and Affect: Mood normal.        Behavior: Behavior normal.     No results found for any visits on 03/30/23.      Assessment & Plan:  Sinus tachycardia Assessment & Plan: Overall patient is stable today.  Normal heart sounds, regular rhythm, sinus tachycardia, lungs clear to auscultation bilaterally.  Thyroid nodule palpated.  Will go ahead with thyroid function tests including TSH, T4, T3.  Will refer for thyroid ultrasound.  Patient agreeable to plan.  Orders: -     TSH + free T4 -     T3 -     US THYROID; Future  Acute diffuse otitis externa of both ears -     Ciprofloxacin-dexAMETHasone; Place 4 drops into both ears 2 (two)  times daily.  Dispense: 7.5 mL; Refill: 0  Bilateral ear canals with yellow discharge.  Minimal tragus tenderness.  No fevers.  Will start with Ciprodex eardrops twice daily.  Patient advised to follow-up for worsening symptoms.  She is agreeable to plan.   Return if symptoms worsen or fail to improve.  Toni Amend Emilia Kayes, PA-C

## 2023-03-30 NOTE — Assessment & Plan Note (Signed)
 Overall patient is stable today.  Normal heart sounds, regular rhythm, sinus tachycardia, lungs clear to auscultation bilaterally.  Thyroid nodule palpated.  Will go ahead with thyroid function tests including TSH, T4, T3.  Will refer for thyroid ultrasound.  Patient agreeable to plan.

## 2023-03-31 ENCOUNTER — Ambulatory Visit: Admitting: Nurse Practitioner

## 2023-03-31 LAB — TSH+FREE T4
Free T4: 1.22 ng/dL (ref 0.82–1.77)
TSH: 1.35 u[IU]/mL (ref 0.450–4.500)

## 2023-03-31 LAB — T3: T3, Total: 301 ng/dL — ABNORMAL HIGH (ref 71–180)

## 2023-04-01 ENCOUNTER — Encounter: Payer: Self-pay | Admitting: Physician Assistant

## 2023-04-04 ENCOUNTER — Ambulatory Visit (INDEPENDENT_AMBULATORY_CARE_PROVIDER_SITE_OTHER): Payer: Managed Care, Other (non HMO) | Admitting: Advanced Practice Midwife

## 2023-04-04 ENCOUNTER — Encounter: Payer: Self-pay | Admitting: Advanced Practice Midwife

## 2023-04-04 VITALS — BP 104/78 | HR 150 | Wt 187.0 lb

## 2023-04-04 DIAGNOSIS — Z3402 Encounter for supervision of normal first pregnancy, second trimester: Secondary | ICD-10-CM | POA: Diagnosis not present

## 2023-04-04 DIAGNOSIS — Z1379 Encounter for other screening for genetic and chromosomal anomalies: Secondary | ICD-10-CM | POA: Diagnosis not present

## 2023-04-04 DIAGNOSIS — Z3A16 16 weeks gestation of pregnancy: Secondary | ICD-10-CM | POA: Diagnosis not present

## 2023-04-04 MED ORDER — ONDANSETRON 8 MG PO TBDP
8.0000 mg | ORAL_TABLET | Freq: Three times a day (TID) | ORAL | 2 refills | Status: DC | PRN
Start: 2023-04-04 — End: 2023-05-23

## 2023-04-04 NOTE — Patient Instructions (Signed)
 Toni Moss  04/04/2023     @PREFPERIOPPHARMACY @   Your procedure is scheduled on  04/08/2023.   Report to [facility] at  0900 A.M.   Call this number if you have problems the morning of surgery:  [phone]  If you experience any cold or flu symptoms such as cough, fever, chills, shortness of breath, etc. between now and your scheduled surgery, please notify us at the above number.   Remember:  Do not eat after midnight.   You may drink clear liquids until 0700 am on 04/08/2023.    Clear liquids allowed are:                    Water, Juice (No red color; non-citric and without pulp; diabetics please choose diet or no sugar options), Carbonated beverages (diabetics please choose diet or no sugar options), Clear Tea (No creamer, milk, or cream, including half & half and powdered creamer), Black Coffee Only (No creamer, milk or cream, including half & half and powdered creamer), and Clear Sports drink (No red color; diabetics please choose diet or no sugar options)    Take these medicines the morning of surgery with A SIP OF WATER                          omeprazole, zofran (if needed).     Do not wear jewelry, make-up or nail polish, including gel polish,  artificial nails, or any other type of covering on natural nails (fingers and  toes).  Do not wear lotions, powders, or perfumes, or deodorant.  Do not shave 48 hours prior to surgery.  Men may shave face and neck.  Do not bring valuables to the hospital.  [facility] is not responsible for any belongings or valuables.  Contacts, dentures or bridgework may not be worn into surgery.  Leave your suitcase in the car.  After surgery it may be brought to your room.  For patients admitted to the hospital, discharge time will be determined by your treatment team.  Patients discharged the day of surgery will not be allowed to drive home and must have someone with them for 24 hours.     Special instructions:   DO  NOT smoke tobacco or vape for 24 hours before your procedure.  Please read over the following fact sheets that you were given. Coughing and Deep Breathing, Surgical Site Infection Prevention, Anesthesia Post-op Instructions, and Care and Recovery After Surgery      Minimally Invasive Cholecystectomy, Care After The following information offers guidance on how to care for yourself after your procedure. Your health care provider may also give you more specific instructions. If you have problems or questions, contact your health care provider. What can I expect after the procedure? After the procedure, it is common to have: Pain at your incision sites. You will be given medicines to control this pain. Mild nausea or vomiting. Bloating and possible shoulder pain from the gas that was used during the procedure. Follow these instructions at home: Medicines Take over-the-counter and prescription medicines only as told by your health care provider. If you were prescribed an antibiotic medicine, take it as told by your health care provider. Do not stop using the antibiotic even if you start to feel better. Ask your health care provider if the medicine prescribed to you: Requires you to avoid driving or using machinery. Can cause  constipation. You may need to take these actions to prevent or treat constipation: Drink enough fluid to keep your urine pale yellow. Take over-the-counter or prescription medicines. Eat foods that are high in fiber, such as beans, whole grains, and fresh fruits and vegetables. Limit foods that are high in fat and processed sugars, such as fried or sweet foods. Incision care  Follow instructions from your health care provider about how to take care of your incisions. Make sure you: Wash your hands with soap and water for at least 20 seconds before and after you change your bandage (dressing). If soap and water are not available, use hand sanitizer. Change your dressing as  told by your health care provider. Leave stitches (sutures), skin glue, or adhesive strips in place. These skin closures may need to be in place for 2 weeks or longer. If adhesive strip edges start to loosen and curl up, you may trim the loose edges. Do not remove adhesive strips completely unless your health care provider tells you to do that. Do not take baths, swim, or use a hot tub until your health care provider approves. Ask your health care provider if you may take showers. You may only be allowed to take sponge baths. Check your incision area every day for signs of infection. Check for: More redness, swelling, or pain. Fluid or blood. Warmth. Pus or a bad smell. Activity Rest as told by your health care provider. Do not do activities that require a lot of effort. Avoid sitting for a long time without moving. Get up to take short walks every 1-2 hours. This is important to improve blood flow and breathing. Ask for help if you feel weak or unsteady. Do not lift anything that is heavier than 10 lb (4.5 kg), or the limit that you are told, until your health care provider says that it is safe. Do not play contact sports until your health care provider approves. Do not return to work or school until your health care provider approves. Return to your normal activities as told by your health care provider. Ask your health care provider what activities are safe for you. General instructions If you were given a sedative during the procedure, it can affect you for several hours. Do not drive or operate machinery until your health care provider says that it is safe. Keep all follow-up visits. This is important. Contact a health care provider if: You develop a rash. You have more redness, swelling, or pain around your incisions. You have fluid or blood coming from your incisions. Your incisions feel warm to the touch. You have pus or a bad smell coming from your incisions. You have a fever. One  or more of your incisions breaks open. Get help right away if: You have trouble breathing. You have chest pain. You have more pain in your shoulders. You faint or feel dizzy when you stand. You have severe pain in your abdomen. You have nausea or vomiting that lasts for more than one day. You have leg pain that is new or unusual, or if it is localized to one specific spot. These symptoms may represent a serious problem that is an emergency. Do not wait to see if the symptoms will go away. Get medical help right away. Call your local emergency services (911 in the [place] Do not drive yourself to the hospital. Summary After your procedure, it is common to have pain at the incision sites. You may also have nausea or bloating. Follow  your health care provider's instructions about medicine, activity restrictions, and caring for your incision areas. Do not do activities that require a lot of effort. Contact a health care provider if you have a fever or other signs of infection, such as more redness, swelling, or pain around the incisions. Get help right away if you have chest pain, increasing pain in the shoulders, or trouble breathing. This information is not intended to replace advice given to you by your health care provider. Make sure you discuss any questions you have with your health care provider. Document Revised: 07/07/2020 Document Reviewed: 07/08/2020 Elsevier Patient Education  2024 [employer]General Anesthesia, Adult, Care After The following information offers guidance on how to care for yourself after your procedure. Your health care provider may also give you more specific instructions. If you have problems or questions, contact your health care provider. What can I expect after the procedure? After the procedure, it is common for people to: Have pain or discomfort at the IV site. Have nausea or vomiting. Have a sore throat or hoarseness. Have trouble concentrating. Feel cold  or chills. Feel weak, sleepy, or tired (fatigue). Have soreness and body aches. These can affect parts of the body that were not involved in surgery. Follow these instructions at home: For the time period you were told by your health care provider:  Rest. Do not participate in activities where you could fall or become injured. Do not drive or use machinery. Do not drink alcohol. Do not take sleeping pills or medicines that cause drowsiness. Do not make important decisions or sign legal documents. Do not take care of children on your own. General instructions Drink enough fluid to keep your urine pale yellow. If you have sleep apnea, surgery and certain medicines can increase your risk for breathing problems. Follow instructions from your health care provider about wearing your sleep device: Anytime you are sleeping, including during daytime naps. While taking prescription pain medicines, sleeping medicines, or medicines that make you drowsy. Return to your normal activities as told by your health care provider. Ask your health care provider what activities are safe for you. Take over-the-counter and prescription medicines only as told by your health care provider. Do not use any products that contain nicotine or tobacco. These products include cigarettes, chewing tobacco, and vaping devices, such as e-cigarettes. These can delay incision healing after surgery. If you need help quitting, ask your health care provider. Contact a health care provider if: You have nausea or vomiting that does not get better with medicine. You vomit every time you eat or drink. You have pain that does not get better with medicine. You cannot urinate or have bloody urine. You develop a skin rash. You have a fever. Get help right away if: You have trouble breathing. You have chest pain. You vomit blood. These symptoms may be an emergency. Get help right away. Call 911. Do not wait to see if the symptoms  will go away. Do not drive yourself to the hospital. Summary After the procedure, it is common to have a sore throat, hoarseness, nausea, vomiting, or to feel weak, sleepy, or fatigue. For the time period you were told by your health care provider, do not drive or use machinery. Get help right away if you have difficulty breathing, have chest pain, or vomit blood. These symptoms may be an emergency. This information is not intended to replace advice given to you by your health care provider. Make sure you discuss any  questions you have with your health care provider. Document Revised: 04/03/2021 Document Reviewed: 04/03/2021 Elsevier Patient Education  2024 [employer].How to Use Chlorhexidine at Home in the Shower Chlorhexidine gluconate (CHG) is a germ-killing (antiseptic) wash that's used to clean the skin. It can get rid of the germs that normally live on the skin and can keep them away for about 24 hours. If you're having surgery, you may be told to shower with CHG at home the night before surgery. This can help lower your risk for infection. To use CHG wash in the shower, follow the steps below. Supplies needed: CHG body wash. Clean washcloth. Clean towel. How to use CHG in the shower Follow these steps unless you're told to use CHG in a different way: Start the shower. Use your normal soap and shampoo to wash your face and hair. Turn off the shower or move out of the shower stream. Pour CHG onto a clean washcloth. Do not use any type of brush or rough sponge. Start at your neck, washing your body down to your toes. Make sure you: Wash the part of your body where the surgery will be done for at least 1 minute. Do not scrub. Do not use CHG on your head or face unless your health care provider tells you to. If it gets into your ears or eyes, rinse them well with water. Do not wash your genitals with CHG. Wash your back and under your arms. Make sure to wash skin folds. Let the CHG  sit on your skin for 1-2 minutes or as long as told. Rinse your entire body in the shower, including all body creases and folds. Turn off the shower. Dry off with a clean towel. Do not put anything on your skin afterward, such as powder, lotion, or perfume. Put on clean clothes or pajamas. If it's the night before surgery, sleep in clean sheets. General tips Use CHG only as told, and follow the instructions on the label. Use the full amount of CHG as told. This is often one bottle. Do not smoke and stay away from flames after using CHG. Your skin may feel sticky after using CHG. This is normal. The sticky feeling will go away as the CHG dries. Do not use CHG: If you have a chlorhexidine allergy or have reacted to chlorhexidine in the past. On open wounds or areas of skin that have broken skin, cuts, or scrapes. On babies younger than 43 months of age. Contact a health care provider if: You have questions about using CHG. Your skin gets irritated or itchy. You have a rash after using CHG. You swallow any CHG. Call your local poison control center (619)260-5223 in the [place] Your eyes itch badly, or they become very red or swollen. Your hearing changes. You have trouble seeing. If you can't reach your provider, go to an urgent care or emergency room. Do not drive yourself. Get help right away if: You have swelling or tingling in your mouth or throat. You make high-pitched whistling sounds when you breathe, most often when you breathe out (wheeze). You have trouble breathing. These symptoms may be an emergency. Call 911 right away. Do not wait to see if the symptoms will go away. Do not drive yourself to the hospital. This information is not intended to replace advice given to you by your health care provider. Make sure you discuss any questions you have with your health care provider. Document Revised: 07/20/2022 Document Reviewed: 07/16/2021 Elsevier Patient Education  2024 [employer]  Inc[employer]

## 2023-04-04 NOTE — Patient Instructions (Signed)
 For Headaches:  Stay well hydrated, drink enough water so that your urine is clear, sometimes if you are dehydrated you can get headaches Eat small frequent meals and snacks, sometimes if you are hungry you can get headaches Sometimes you get headaches during pregnancy from the pregnancy hormones You can try tylenol (1-2 regular strength 325mg  or 1-2 extra strength 500mg ) as directed on the box. The least amount of medication that works is best.  Cool compresses (cool wet washcloth or ice pack) to area of head that is hurting You can also try drinking a caffeinated drink to see if this will help If not helping, try below:  For Prevention of Headaches/Migraines: CoQ10 100mg  three times daily Vitamin B2 400mg  daily Magnesium Oxide 400-600mg  daily  Foods to alleviate migraines:  1) dark leafy greens 2) avocado 3) tuna 4) salmon  5) beans and legumes  Foods to avoid: 1) Excessive (or irregular timing) coffee 3) aged cheeses 4) chocolate 5) citrus fruits 6) aspartame and other artifical sweeteners 7) yeast 8) MSG (in processed foods) 9) processed and cured meats 10) nuts and certain seeds 11) chicken livers and other organ meats 12) dairy products like buttermilk, sour cream, and yogurt 13) dried fruits like dates, figs, and raisins 14) garlic 15) onions 16) potato chips 17) pickled foods like olives and sauerkraut 18) some fresh fruits like ripe banana, papaya, red plums, raspberries, kiwi, pineapple 19) tomato-based products  Recommend to keep a migraine diary: rate daily the severity of your headache (1-10) and what foods you eat that day to help determine patterns.   If You Get a Bad Headache/Migraine: Benadryl 25mg   Magnesium Oxide 1 large Gatorade 2 extra strength Tylenol (1,000mg  total) 1 cup coffee or Coke      If this doesn't help please call us @ [phone]

## 2023-04-04 NOTE — Progress Notes (Signed)
   LOW-RISK PREGNANCY VISIT Patient name: Toni Moss MRN [id number]  Date of birth: October 17, 2000 Chief Complaint:   Routine Prenatal Visit (2nd IT)  History of Present Illness:   Toni Moss is a 23 y.o. G74P0000 female at [redacted]w[redacted]d with an Estimated Date of Delivery: 09/13/23 being seen today for ongoing management of a low-risk pregnancy.  Today she reports feeling better wzofran.  Had migraines (took maxalt), gets a HA at 1800 daily.  TIps given, plans to keep "thugging it out" /wo meds.  . Contractions: Not present.  .  Movement: Absent. denies leaking of fluid. Review of Systems:   Pertinent items are noted in HPI Denies abnormal vaginal discharge w/ itching/odor/irritation, headaches, visual changes, shortness of breath, chest pain, abdominal pain, severe nausea/vomiting, or problems with urination or bowel movements unless otherwise stated above. Pertinent History Reviewed:  Reviewed past medical,surgical, social, obstetrical and family history.  Reviewed problem list, medications and allergies. Physical Assessment:   Vitals:   04/04/23 1421 04/04/23 1436  BP: 109/78 104/78  Pulse: (!) 142 (!) 150  Weight: 187 lb (84.8 kg)   Body mass index is 31.12 kg/m.        Physical Examination:   General appearance: Well appearing, and in no distress  Mental status: Alert, oriented to person, place, and time  Skin: Warm & dry  Cardiovascular: Normal heart rate noted  Respiratory: Normal respiratory effort, no distress  Abdomen: Soft, gravid, nontender  Pelvic: Cervical exam deferred         Extremities: Edema: None Chaperone:  N/A   Fetal Status: Fetal Heart Rate (bpm): 141   Movement: Absent      No results found for this or any previous visit (from the past 24 hours).  Assessment & Plan:    Pregnancy: G1P0000 at [redacted]w[redacted]d 1. Encounter for supervision of normal first pregnancy in second trimester (Primary)   2. [redacted] weeks gestation of pregnancy   3.  Multiple psychiatric dx:  has a  psychiatrist, last seen 03/21/23  4.  Plans GB removal next week       Meds:  Meds ordered this encounter  Medications   ondansetron (ZOFRAN-ODT) 8 MG disintegrating tablet    Sig: Take 1 tablet (8 mg total) by mouth every 8 (eight) hours as needed for nausea.    Dispense:  30 tablet    Refill:  2    Supervising Provider:   Lazaro Arms [[id number]]   Labs/procedures today: 2nd IT  Plan:  Continue routine obstetrical care  Next visit: prefers will be in person for [place]     Reviewed:  general obstetric precautions including but not limited to vaginal bleeding, contractions, leaking of fluid and fetal movement were reviewed in detail with the patient.  All questions were answered. Has home bp cuff.. Check bp weekly, let us know if >140/90.   Follow-up: No follow-ups on file.  Future Appointments  Date Time Provider Department Center  04/06/2023  9:30 AM [facility] PAT 2 [facility] None  04/27/2023  3:00 PM [facility] [place] [facility] None  04/27/2023  3:50 PM Cheral Marker, CNM [facility]  05/23/2023  9:30 AM Elsie Lincoln, MD [facility] None  06/20/2023  1:50 PM Tommie Sams, DO [facility]    Orders Placed This Encounter  Procedures   INTEGRATED 2   Jacklyn Shell DNP, CNM 04/04/2023 3:14 PM

## 2023-04-06 ENCOUNTER — Encounter (HOSPITAL_COMMUNITY)
Admission: RE | Admit: 2023-04-06 | Discharge: 2023-04-06 | Disposition: A | Discharge status: Home / Self Care | Source: Ambulatory Visit | Attending: General Surgery | Admitting: General Surgery

## 2023-04-06 ENCOUNTER — Encounter (HOSPITAL_COMMUNITY): Payer: Self-pay

## 2023-04-06 DIAGNOSIS — Z01818 Encounter for other preprocedural examination: Secondary | ICD-10-CM

## 2023-04-06 DIAGNOSIS — Z01812 Encounter for preprocedural laboratory examination: Secondary | ICD-10-CM | POA: Insufficient documentation

## 2023-04-06 LAB — INTEGRATED 2
AFP MoM: 1.24
Alpha-Fetoprotein: 39.4 ng/mL
Crown Rump Length: 63.9 mm
DIA MoM: 0.63
DIA Value: 87 pg/mL
Estriol, Unconjugated: 1.68 ng/mL
Gest. Age on Collection Date: 12.6 wk
Gestational Age: 17.3 wk
Maternal Age at EDD: 20.6 a
Nuchal Translucency (NT): 1.7 mm
Nuchal Translucency MoM: 0.95
Number of Fetuses: 1
PAPP-A MoM: 3.26
PAPP-A Value: 2794.7 ng/mL
Sonographer ID#: 309760
Test Results:: NEGATIVE
Weight: 174 [lb_av]
Weight: 187 [lb_av]
hCG MoM: 1.25
hCG Value: 31.8 [IU]/mL
uE3 MoM: 1.31

## 2023-04-06 LAB — CBC WITH DIFFERENTIAL/PLATELET
Abs Immature Granulocytes: 0.14 10*3/uL — ABNORMAL HIGH (ref 0.00–0.07)
Basophils Absolute: 0 10*3/uL (ref 0.0–0.1)
Basophils Relative: 0 %
Eosinophils Absolute: 0 10*3/uL (ref 0.0–0.5)
Eosinophils Relative: 0 %
HCT: 36.1 % (ref 36.0–46.0)
Hemoglobin: 12.3 g/dL (ref 12.0–15.0)
Immature Granulocytes: 1 %
Lymphocytes Relative: 8 %
Lymphs Abs: 1.3 10*3/uL (ref 0.7–4.0)
MCH: 30.9 pg (ref 26.0–34.0)
MCHC: 34.1 g/dL (ref 30.0–36.0)
MCV: 90.7 fL (ref 80.0–100.0)
Monocytes Absolute: 0.6 10*3/uL (ref 0.1–1.0)
Monocytes Relative: 4 %
Neutro Abs: 13.7 10*3/uL — ABNORMAL HIGH (ref 1.7–7.7)
Neutrophils Relative %: 87 %
Platelets: 333 10*3/uL (ref 150–400)
RBC: 3.98 MIL/uL (ref 3.87–5.11)
RDW: 14.4 % (ref 11.5–15.5)
WBC: 15.8 10*3/uL — ABNORMAL HIGH (ref 4.0–10.5)
nRBC: 0 % (ref 0.0–0.2)

## 2023-04-06 LAB — COMPREHENSIVE METABOLIC PANEL
ALT: 14 U/L (ref 0–44)
AST: 17 U/L (ref 15–41)
Albumin: 3.3 g/dL — ABNORMAL LOW (ref 3.5–5.0)
Alkaline Phosphatase: 67 U/L (ref 38–126)
Anion gap: 15 (ref 5–15)
BUN: 8 mg/dL (ref 6–20)
CO2: 18 mmol/L — ABNORMAL LOW (ref 22–32)
Calcium: 9.4 mg/dL (ref 8.9–10.3)
Chloride: 104 mmol/L (ref 98–111)
Creatinine, Ser: 0.48 mg/dL (ref 0.44–1.00)
GFR, Estimated: >60 mL/min (ref >60)
Glucose, Bld: 94 mg/dL (ref 70–99)
Potassium: 3.6 mmol/L (ref 3.5–5.1)
Sodium: 137 mmol/L (ref 135–145)
Total Bilirubin: 0.5 mg/dL (ref 0.0–1.2)
Total Protein: 7.3 g/dL (ref 6.5–8.1)

## 2023-04-06 LAB — TSH: TSH: 1.016 u[IU]/mL (ref 0.350–4.500)

## 2023-04-07 LAB — T4: T4, Total: 18.7 ug/dL — ABNORMAL HIGH (ref 4.5–12.0)

## 2023-04-07 LAB — T3 UPTAKE: T3 Uptake Ratio: 13 % — ABNORMAL LOW (ref 24–39)

## 2023-04-08 ENCOUNTER — Ambulatory Visit (HOSPITAL_COMMUNITY)
Admission: RE | Admit: 2023-04-08 | Discharge: 2023-04-08 | Disposition: A | Discharge status: Home / Self Care | Attending: General Surgery | Admitting: General Surgery

## 2023-04-08 ENCOUNTER — Encounter (HOSPITAL_COMMUNITY)
Admission: RE | Disposition: A | Discharge status: Home / Self Care | Payer: Self-pay | Source: Home / Self Care | Attending: General Surgery

## 2023-04-08 ENCOUNTER — Encounter (HOSPITAL_COMMUNITY): Payer: Self-pay | Admitting: General Surgery

## 2023-04-08 ENCOUNTER — Ambulatory Visit (HOSPITAL_COMMUNITY): Admitting: Certified Registered"

## 2023-04-08 ENCOUNTER — Ambulatory Visit (HOSPITAL_BASED_OUTPATIENT_CLINIC_OR_DEPARTMENT_OTHER): Admitting: Certified Registered"

## 2023-04-08 DIAGNOSIS — Z01818 Encounter for other preprocedural examination: Secondary | ICD-10-CM

## 2023-04-08 DIAGNOSIS — K8020 Calculus of gallbladder without cholecystitis without obstruction: Secondary | ICD-10-CM | POA: Diagnosis present

## 2023-04-08 DIAGNOSIS — E041 Nontoxic single thyroid nodule: Secondary | ICD-10-CM | POA: Insufficient documentation

## 2023-04-08 DIAGNOSIS — Z3A16 16 weeks gestation of pregnancy: Secondary | ICD-10-CM | POA: Diagnosis not present

## 2023-04-08 DIAGNOSIS — O99282 Endocrine, nutritional and metabolic diseases complicating pregnancy, second trimester: Secondary | ICD-10-CM | POA: Diagnosis not present

## 2023-04-08 DIAGNOSIS — K8070 Calculus of gallbladder and bile duct without cholecystitis without obstruction: Secondary | ICD-10-CM | POA: Diagnosis not present

## 2023-04-08 DIAGNOSIS — K811 Chronic cholecystitis: Secondary | ICD-10-CM | POA: Insufficient documentation

## 2023-04-08 DIAGNOSIS — O99612 Diseases of the digestive system complicating pregnancy, second trimester: Secondary | ICD-10-CM | POA: Diagnosis not present

## 2023-04-08 DIAGNOSIS — O99412 Diseases of the circulatory system complicating pregnancy, second trimester: Secondary | ICD-10-CM | POA: Diagnosis not present

## 2023-04-08 DIAGNOSIS — I471 Supraventricular tachycardia, unspecified: Secondary | ICD-10-CM | POA: Insufficient documentation

## 2023-04-08 SURGERY — CHOLECYSTECTOMY, ROBOT-ASSISTED, LAPAROSCOPIC
Anesthesia: General | Site: Abdomen

## 2023-04-08 MED ORDER — STERILE WATER FOR IRRIGATION IR SOLN
Status: DC | PRN
  Administered 2023-04-08: 1000 mL

## 2023-04-08 MED ORDER — PROPOFOL 10 MG/ML IV BOLUS
INTRAVENOUS | Status: AC
  Filled 2023-04-08: qty 20

## 2023-04-08 MED ORDER — SUGAMMADEX SODIUM 200 MG/2ML IV SOLN
INTRAVENOUS | Status: AC
  Filled 2023-04-08: qty 2

## 2023-04-08 MED ORDER — CEFAZOLIN SODIUM-DEXTROSE 2-4 GM/100ML-% IV SOLN
2.0000 g | INTRAVENOUS | Status: AC
  Administered 2023-04-08: 2 g via INTRAVENOUS

## 2023-04-08 MED ORDER — OXYCODONE HCL 5 MG PO TABS
5.0000 mg | ORAL_TABLET | Freq: Once | ORAL | Status: AC | PRN
  Administered 2023-04-08: 5 mg via ORAL
  Filled 2023-04-08: qty 1

## 2023-04-08 MED ORDER — LIDOCAINE HCL (PF) 2 % IJ SOLN
INTRAMUSCULAR | Status: DC | PRN
  Administered 2023-04-08: 100 mg via INTRADERMAL

## 2023-04-08 MED ORDER — LACTATED RINGERS IV SOLN: INTRAVENOUS | Status: DC

## 2023-04-08 MED ORDER — ACETAMINOPHEN 160 MG/5ML PO SOLN
960.0000 mg | Freq: Once | ORAL | Status: DC
  Filled 2023-04-08: qty 30

## 2023-04-08 MED ORDER — BUPIVACAINE HCL (PF) 0.5 % IJ SOLN
INTRAMUSCULAR | Status: DC | PRN
  Administered 2023-04-08: 30 mL

## 2023-04-08 MED ORDER — CHLORHEXIDINE GLUCONATE 0.12 % MT SOLN
OROMUCOSAL | Status: AC
  Administered 2023-04-08: 15 mL
  Filled 2023-04-08: qty 15

## 2023-04-08 MED ORDER — ROCURONIUM BROMIDE 10 MG/ML (PF) SYRINGE
PREFILLED_SYRINGE | INTRAVENOUS | Status: AC
  Filled 2023-04-08: qty 10

## 2023-04-08 MED ORDER — FENTANYL CITRATE (PF) 100 MCG/2ML IJ SOLN
INTRAMUSCULAR | Status: AC
  Filled 2023-04-08: qty 2

## 2023-04-08 MED ORDER — SUCCINYLCHOLINE CHLORIDE 200 MG/10ML IV SOSY
PREFILLED_SYRINGE | INTRAVENOUS | Status: AC
  Filled 2023-04-08: qty 10

## 2023-04-08 MED ORDER — HYDROMORPHONE HCL 1 MG/ML IJ SOLN
INTRAMUSCULAR | Status: AC
  Filled 2023-04-08: qty 0.5

## 2023-04-08 MED ORDER — ACETAMINOPHEN 500 MG PO TABS
1000.0000 mg | ORAL_TABLET | Freq: Once | ORAL | Status: DC
  Filled 2023-04-08: qty 2

## 2023-04-08 MED ORDER — OXYCODONE HCL 5 MG/5ML PO SOLN: 5.0000 mg | Freq: Once | ORAL | Status: AC | PRN

## 2023-04-08 MED ORDER — LIDOCAINE HCL (PF) 2 % IJ SOLN
INTRAMUSCULAR | Status: AC
  Filled 2023-04-08: qty 5

## 2023-04-08 MED ORDER — HYDROCODONE-ACETAMINOPHEN 5-325 MG PO TABS: 1.0000 | ORAL_TABLET | ORAL | 0 refills | Status: DC | PRN

## 2023-04-08 MED ORDER — CHLORHEXIDINE GLUCONATE CLOTH 2 % EX PADS
6.0000 | MEDICATED_PAD | Freq: Once | CUTANEOUS | Status: AC
  Administered 2023-04-08: 6 via TOPICAL

## 2023-04-08 MED ORDER — PROPOFOL 10 MG/ML IV BOLUS
INTRAVENOUS | Status: DC | PRN
  Administered 2023-04-08: 200 mg via INTRAVENOUS

## 2023-04-08 MED ORDER — FENTANYL CITRATE (PF) 100 MCG/2ML IJ SOLN
INTRAMUSCULAR | Status: DC | PRN
  Administered 2023-04-08 (×4): 50 ug via INTRAVENOUS

## 2023-04-08 MED ORDER — ROCURONIUM BROMIDE 10 MG/ML (PF) SYRINGE: PREFILLED_SYRINGE | INTRAVENOUS | Status: DC | PRN

## 2023-04-08 MED ORDER — LACTATED RINGERS IV SOLN: INTRAVENOUS | Status: DC | PRN

## 2023-04-08 MED ORDER — CEFAZOLIN SODIUM-DEXTROSE 2-4 GM/100ML-% IV SOLN
INTRAVENOUS | Status: AC
Start: 2023-04-08 — End: 2023-04-08
  Filled 2023-04-08: qty 100

## 2023-04-08 MED ORDER — SUGAMMADEX SODIUM 200 MG/2ML IV SOLN
INTRAVENOUS | Status: DC | PRN
  Administered 2023-04-08: 200 mg via INTRAVENOUS

## 2023-04-08 MED ORDER — ORAL CARE MOUTH RINSE: 15.0000 mL | Freq: Once | OROMUCOSAL | Status: DC

## 2023-04-08 MED ORDER — CHLORHEXIDINE GLUCONATE CLOTH 2 % EX PADS: 6.0000 | MEDICATED_PAD | Freq: Once | CUTANEOUS | Status: DC

## 2023-04-08 MED ORDER — ROCURONIUM BROMIDE 10 MG/ML (PF) SYRINGE
PREFILLED_SYRINGE | INTRAVENOUS | Status: DC | PRN
  Administered 2023-04-08: 5 mg via INTRAVENOUS
  Administered 2023-04-08: 35 mg via INTRAVENOUS

## 2023-04-08 MED ORDER — HYDROMORPHONE HCL 1 MG/ML IJ SOLN
0.2500 mg | INTRAMUSCULAR | Status: DC | PRN
  Administered 2023-04-08 (×2): 0.5 mg via INTRAVENOUS
  Filled 2023-04-08: qty 0.5

## 2023-04-08 MED ORDER — CHLORHEXIDINE GLUCONATE 0.12 % MT SOLN
15.0000 mL | Freq: Once | OROMUCOSAL | Status: DC
Start: 2023-04-08 — End: 2023-04-08

## 2023-04-08 MED ORDER — PROPOFOL 500 MG/50ML IV EMUL
INTRAVENOUS | Status: DC | PRN
  Administered 2023-04-08: 25 ug/kg/min via INTRAVENOUS

## 2023-04-08 MED ORDER — SUCCINYLCHOLINE CHLORIDE 200 MG/10ML IV SOSY
PREFILLED_SYRINGE | INTRAVENOUS | Status: DC | PRN
  Administered 2023-04-08: 120 mg via INTRAVENOUS

## 2023-04-08 SURGICAL SUPPLY — 36 items
CAUTERY HOOK MNPLR 1.6 DVNC XI (INSTRUMENTS) ×1 IMPLANT
CHLORAPREP W/TINT 26 (MISCELLANEOUS) ×1 IMPLANT
CLIP LIGATING HEM O LOK PURPLE (MISCELLANEOUS) ×1 IMPLANT
DERMABOND ADVANCED .7 DNX12 (GAUZE/BANDAGES/DRESSINGS) ×1 IMPLANT
DRAPE ARM DVNC X/XI (DISPOSABLE) ×4 IMPLANT
DRAPE COLUMN DVNC XI (DISPOSABLE) ×1 IMPLANT
ELECT REM PT RETURN 9FT ADLT (ELECTROSURGICAL) ×1 IMPLANT
ELECTRODE REM PT RTRN 9FT ADLT (ELECTROSURGICAL) ×1 IMPLANT
FORCEPS BPLR R/ABLATION 8 DVNC (INSTRUMENTS) ×1 IMPLANT
FORCEPS PROGRASP DVNC XI (FORCEP) ×1 IMPLANT
GLOVE BIOGEL PI IND STRL 7.0 (GLOVE) ×2 IMPLANT
GLOVE SURG SS PI 7.5 STRL IVOR (GLOVE) ×2 IMPLANT
GOWN STRL REUS W/TWL LRG LVL3 (GOWN DISPOSABLE) ×3 IMPLANT
HEMOSTAT SNOW SURGICEL 2X4 (HEMOSTASIS) IMPLANT
KIT TURNOVER KIT A (KITS) ×1 IMPLANT
MANIFOLD NEPTUNE II (INSTRUMENTS) ×1 IMPLANT
NDL HYPO 21X1.5 SAFETY (NEEDLE) ×1 IMPLANT
NDL INSUFFLATION 14GA 120MM (NEEDLE) ×1 IMPLANT
NEEDLE HYPO 21X1.5 SAFETY (NEEDLE) ×1 IMPLANT
NEEDLE INSUFFLATION 14GA 120MM (NEEDLE) ×1 IMPLANT
OBTURATOR OPTICAL STND 8 DVNC (TROCAR) ×2 IMPLANT
OBTURATOR OPTICALSTD 8 DVNC (TROCAR) ×1 IMPLANT
PACK LAP CHOLE LZT030E (CUSTOM PROCEDURE TRAY) ×1 IMPLANT
PAD ARMBOARD POSITIONER FOAM (MISCELLANEOUS) ×1 IMPLANT
PENCIL HANDSWITCHING (ELECTRODE) ×1 IMPLANT
POSITIONER HEAD 8X9X4 ADT (SOFTGOODS) ×1 IMPLANT
SEAL UNIV 5-12 XI (MISCELLANEOUS) ×4 IMPLANT
SET BASIN LINEN APH (SET/KITS/TRAYS/PACK) ×1 IMPLANT
SET TUBE SMOKE EVAC HIGH FLOW (TUBING) ×1 IMPLANT
SPIKE FLUID TRANSFER (MISCELLANEOUS) ×1 IMPLANT
SUT MNCRL AB 4-0 PS2 18 (SUTURE) ×2 IMPLANT
SUT VICRYL 0 AB UR-6 (SUTURE) ×1 IMPLANT
SYR 30ML LL (SYRINGE) ×1 IMPLANT
SYS RETRIEVAL 5MM INZII UNIV (BASKET) ×1 IMPLANT
SYSTEM RETRIEVL 5MM INZII UNIV (BASKET) ×1 IMPLANT
WATER STERILE IRR 500ML POUR (IV SOLUTION) ×1 IMPLANT

## 2023-04-08 NOTE — Anesthesia Postprocedure Evaluation (Signed)
 Anesthesia Post Note  Patient: Toni Moss  Procedure(s) Performed: CHOLECYSTECTOMY, ROBOT-ASSISTED, LAPAROSCOPIC (Abdomen)  Patient location during evaluation: PACU Anesthesia Type: General Level of consciousness: awake and alert Pain management: pain level controlled Vital Signs Assessment: post-procedure vital signs reviewed and stable Respiratory status: spontaneous breathing, nonlabored ventilation, respiratory function stable and patient connected to nasal cannula oxygen Cardiovascular status: blood pressure returned to baseline and stable Postop Assessment: no apparent nausea or vomiting Anesthetic complications: no   There were no known notable events for this encounter.   Last Vitals:  Vitals:   04/08/23 1245 04/08/23 1300  BP: 122/82 118/86  Pulse: 88 88  Resp: 10 13  Temp:    SpO2: 100% 100%    Last Pain:  Vitals:   04/08/23 1309  TempSrc:   PainSc: 9                  Anniebelle Devore L Quindarius Cabello

## 2023-04-08 NOTE — Anesthesia Procedure Notes (Signed)
 Procedure Name: Intubation Date/Time: 04/08/2023 11:06 AM  Performed by: Julian Reil, CRNAPre-anesthesia Checklist: Patient identified, Emergency Drugs available, Suction available and Patient being monitored Patient Re-evaluated:Patient Re-evaluated prior to induction Oxygen Delivery Method: Circle system utilized Preoxygenation: Pre-oxygenation with 100% oxygen Induction Type: IV induction, Rapid sequence and Cricoid Pressure applied Laryngoscope Size: Miller and 3 Grade View: Grade I Tube type: Oral Tube size: 7.0 mm Number of attempts: 1 Airway Equipment and Method: Stylet Placement Confirmation: ETT inserted through vocal cords under direct vision, positive ETCO2 and breath sounds checked- equal and bilateral Secured at: 22 cm Tube secured with: Tape Dental Injury: Teeth and Oropharynx as per pre-operative assessment

## 2023-04-08 NOTE — Interval H&P Note (Signed)
 History and Physical Interval Note:  04/08/2023 10:28 AM  Toni Moss  has presented today for surgery, with the diagnosis of CHOLELITHIASIS.  The various methods of treatment have been discussed with the patient and family. After consideration of risks, benefits and other options for treatment, the patient has consented to  Procedure(s) with comments: CHOLECYSTECTOMY, ROBOT-ASSISTED, LAPAROSCOPIC (N/A) - PATIENT IS PREGNANT- [redacted] WEEKS GESTATION as a surgical intervention.  The patient's history has been reviewed, patient examined, no change in status, stable for surgery.  I have reviewed the patient's chart and labs.  Questions were answered to the patient's satisfaction.     [name]

## 2023-04-08 NOTE — Op Note (Signed)
 Patient:  Toni Moss  DOB:  October 28, 2000  MRN:  [id number]   Preop Diagnosis: Biliary colic, cholelithiasis, second trimester gestation of pregnancy  Postop Diagnosis: Same  Procedure: Robotic assisted laparoscopic cholecystectomy  Surgeon: [name], MD  Anes: General Endotracheal  Indications: Patient is a 23 year old white female who is approximately 37 to [redacted] weeks gestation who presents for a cholecystectomy due to inability to maintain p.o. nutrition due to biliary colic.  The risks and benefits of the procedure including bleeding, infection, hepatobiliary injury, miscarriage, and the possibility of an open procedure were fully explained to the patient, who gave informed consent.  Procedure note: Fetal heart tones at a rate of 155 was noted preoperatively.  The patient was placed in the supine position.  After induction of general endotracheal anesthesia, the abdomen was prepped and draped using usual sterile technique with ChloraPrep.  Surgical site confirmation was performed.  The fundal height was noted to be just below the umbilicus.  A supraumbilical incision was made and a Veress needle was carefully placed into the abdominal cavity without difficulty.  Confirmation of placement was done using the saline drop test.  The abdomen was then insufflated to 15 mmHg pressure.  An 8 mm trocar was introduced into the abdominal cavity under direct visualization without difficulty.  Additional 8 mm trocars were placed in the left upper quadrant, right lower abdomen, and right flank regions.  The uterus was inspected and no evidence of disruption of the uterine wall was noted.  The robot was then docked and targeted.  The patient was placed in reverse Trendelenburg position.  The liver appeared to be within normal limits.  The gallbladder was retracted in a dynamic fashion in order to provide a critical view of the triangle of [place]ot.  The cystic duct was first identified.  Its juncture to the  infundibulum was fully identified.  Hem-o-lok clips were placed proximally or distally on the cystic duct and the cystic duct was divided.  This was likewise done and cystic artery.  The gallbladder is freed away from the gallbladder fossa using Bovie electrocautery.  Surgicel was placed in the gallbladder fossa.  No bleeding or bile leakage was noted.  The gallbladder was then removed using an Endo Catch bag without difficulty.  The robot was undocked and all air was evacuated from the abdominal cavity prior to removal of the trocars.  All wounds were irrigated with normal saline.  All wounds were injected with 0.5% Sensorcaine.  The supraumbilical fascia was reapproximated using an 0 Vicryl interrupted suture.  All skin incisions were closed using a 4-0 Monocryl subcuticular suture.  Dermabond was applied.  All tape and needle counts were correct at the end of the procedure.  The patient was extubated in the operating room and transferred to PACU in stable condition.  Fetal heart tones at 135 per noted in PACU.  Complications: None  EBL: Minimal  Specimen: Gallbladder

## 2023-04-08 NOTE — Progress Notes (Signed)
 Fetal Heart Tones 135 after Robotic Assisted Laparoscopic Cholecystectomy.

## 2023-04-08 NOTE — Transfer of Care (Signed)
 Immediate Anesthesia Transfer of Care Note  Patient: Toni Moss  Procedure(s) Performed: CHOLECYSTECTOMY, ROBOT-ASSISTED, LAPAROSCOPIC (Abdomen)  Patient Location: PACU  Anesthesia Type:General  Level of Consciousness: awake, alert , and oriented  Airway & Oxygen Therapy: Patient Spontanous Breathing and Patient connected to face mask oxygen  Post-op Assessment: Report given to RN and Post -op Vital signs reviewed and stable  Post vital signs: Reviewed and stable  Last Vitals:  Vitals Value Taken Time  BP 129/92   Temp 98.2   Pulse 95 04/08/23 1218  Resp 16 04/08/23 1218  SpO2 100 % 04/08/23 1218  Vitals shown include unfiled device data.  Last Pain:  Vitals:   04/08/23 0943  TempSrc: Oral  PainSc: 0-No pain         Complications: No notable events documented.

## 2023-04-08 NOTE — Anesthesia Preprocedure Evaluation (Signed)
 Anesthesia Evaluation  Patient identified by MRN, date of birth, ID band Patient awake    Reviewed: Allergy & Precautions, H&P , NPO status , Patient's Chart, lab work & pertinent test results, reviewed documented beta blocker date and time   Airway Mallampati: II  TM Distance: >3 FB Neck ROM: full    Dental no notable dental hx. (+) Dental Advisory Given, Teeth Intact   Pulmonary neg pulmonary ROS   Pulmonary exam normal breath sounds clear to auscultation       Cardiovascular Exercise Tolerance: Good negative cardio ROS Normal cardiovascular exam Rhythm:regular Rate:Normal     Neuro/Psych  PSYCHIATRIC DISORDERS Anxiety Depression    PTSD.  Borderline personality disorder.  Panic disordernegative neurological ROS     GI/Hepatic negative GI ROS, Neg liver ROS,,,  Endo/Other  negative endocrine ROS    Renal/GU negative Renal ROS  negative genitourinary   Musculoskeletal   Abdominal   Peds  Hematology negative hematology ROS (+)   Anesthesia Other Findings   Reproductive/Obstetrics (+) Pregnancy 2nd trimester pregnancy                             Anesthesia Physical Anesthesia Plan  ASA: 2  Anesthesia Plan: General   Post-op Pain Management: Dilaudid IV   Induction:   PONV Risk Score and Plan: Propofol infusion and Dexamethasone  Airway Management Planned: Oral ETT  Additional Equipment: None  Intra-op Plan:   Post-operative Plan: Extubation in OR  Informed Consent: I have reviewed the patients History and Physical, chart, labs and discussed the procedure including the risks, benefits and alternatives for the proposed anesthesia with the patient or authorized representative who has indicated his/her understanding and acceptance.     Dental Advisory Given  Plan Discussed with: CRNA  Anesthesia Plan Comments:        Anesthesia Quick Evaluation

## 2023-04-11 ENCOUNTER — Encounter: Payer: Self-pay | Admitting: Obstetrics & Gynecology

## 2023-04-11 LAB — SURGICAL PATHOLOGY

## 2023-04-13 ENCOUNTER — Ambulatory Visit (HOSPITAL_COMMUNITY)
Admission: RE | Admit: 2023-04-13 | Discharge: 2023-04-13 | Disposition: A | Discharge status: Home / Self Care | Source: Ambulatory Visit | Attending: Physician Assistant | Admitting: Physician Assistant

## 2023-04-13 DIAGNOSIS — R000 Tachycardia, unspecified: Secondary | ICD-10-CM

## 2023-04-21 ENCOUNTER — Ambulatory Visit (INDEPENDENT_AMBULATORY_CARE_PROVIDER_SITE_OTHER): Admitting: General Surgery

## 2023-04-21 ENCOUNTER — Encounter: Payer: Self-pay | Admitting: General Surgery

## 2023-04-21 VITALS — BP 100/70 | HR 114 | Temp 98.6°F | Resp 14 | Ht 65.0 in | Wt 184.0 lb

## 2023-04-21 DIAGNOSIS — Z09 Encounter for follow-up examination after completed treatment for conditions other than malignant neoplasm: Secondary | ICD-10-CM

## 2023-04-21 NOTE — Progress Notes (Signed)
 Subjective:     Toni Moss  Patient here for postoperative visit, status post robotic assisted laparoscopic cholecystectomy.  She states she is doing very well.  She only suffers from morning sickness for which she takes a Zofran.  She only took pain medications once postoperatively.  Her preoperative upper abdominal pain, nausea, vomiting have resolved.  She denies any spotting.  She has seen her obstetrician next week.  She did have an ultrasound of her thyroid gland but it has not been read yet. Objective:    BP 100/70   Pulse (!) 114   Temp 98.6 F (37 C) (Oral)   Resp 14   Ht 5\' 5"  (1.651 m)   Wt 184 lb (83.5 kg)   LMP 12/07/2022 (Exact Date)   SpO2 98%   BMI 30.62 kg/m   General:  alert, cooperative, and no distress  Abdomen is soft, incisions healing well.     Assessment:    Doing well postoperatively.    Plan:   May resume normal activity as able.  Follow-up here as needed.

## 2023-04-26 ENCOUNTER — Other Ambulatory Visit: Payer: Self-pay | Admitting: Physician Assistant

## 2023-04-26 ENCOUNTER — Encounter: Payer: Self-pay | Admitting: Physician Assistant

## 2023-04-26 DIAGNOSIS — E041 Nontoxic single thyroid nodule: Secondary | ICD-10-CM

## 2023-04-27 ENCOUNTER — Ambulatory Visit: Payer: Managed Care, Other (non HMO) | Admitting: Women's Health

## 2023-04-27 ENCOUNTER — Encounter: Payer: Self-pay | Admitting: Women's Health

## 2023-04-27 ENCOUNTER — Ambulatory Visit: Payer: Managed Care, Other (non HMO)

## 2023-04-27 VITALS — BP 121/84 | HR 134 | Wt 190.0 lb

## 2023-04-27 DIAGNOSIS — Z363 Encounter for antenatal screening for malformations: Secondary | ICD-10-CM

## 2023-04-27 DIAGNOSIS — Z3A20 20 weeks gestation of pregnancy: Secondary | ICD-10-CM

## 2023-04-27 DIAGNOSIS — Z3402 Encounter for supervision of normal first pregnancy, second trimester: Secondary | ICD-10-CM | POA: Diagnosis not present

## 2023-04-27 NOTE — Progress Notes (Signed)
 [place] 20+1 wks,cephalic,anterior placenta gr 0,FHR 161 bpm,normal ovaries,CX 2.9 cm,SVP of fluid 4.3 cm,EFW 342 g 51%,anatomy complete,no obvious abnormalities

## 2023-04-27 NOTE — Progress Notes (Signed)
 LOW-RISK PREGNANCY VISIT Patient name: Toni Moss MRN [id number]  Date of birth: [birth year] Chief Complaint:   Routine Prenatal Visit (Anatomy scan)  History of Present Illness:   Toni Moss is a 23 y.o. G75P0000 female at [redacted]w[redacted]d with an Estimated Date of Delivery: 09/13/23 being seen today for ongoing management of a low-risk pregnancy.   Today she reports no complaints. S/P lap chole w/ Lovell Sheehan 3/21, doing well.  Contractions: Not present. Vag. Bleeding: None.  Movement: Present. denies leaking of fluid.     03/30/2023    9:34 AM 03/02/2023    2:24 PM 12/20/2022    2:45 PM 12/20/2022    1:14 PM 11/04/2022    9:34 AM  Depression screen PHQ 2/9  Decreased Interest 1 2 1 3 3   Down, Depressed, Hopeless 1 2 1 3 3   PHQ - 2 Score 2 4 2 6 6   Altered sleeping 2 2 1 3 3   Tired, decreased energy 2 2 3 3 3   Change in appetite 2 2 2 3 3   Feeling bad or failure about yourself  0 2 0 3 3  Trouble concentrating 3 2 3 3 3   Moving slowly or fidgety/restless 3 2  3 3   Suicidal thoughts 0 0 0 0 0  PHQ-9 Score 14 16 11 24 24   Difficult doing work/chores Somewhat difficult  Very difficult  Extremely dIfficult        03/30/2023    9:35 AM 03/02/2023    2:24 PM 12/20/2022    1:15 PM 11/04/2022    9:35 AM  GAD 7 : Generalized Anxiety Score  Nervous, Anxious, on Edge 2 2 3 3   Control/stop worrying 1 2 3 3   Worry too much - different things 1 2 3 3   Trouble relaxing 1 2 3 3   Restless 2 2 3 2   Easily annoyed or irritable 2 3 3 3   Afraid - awful might happen 1 3 3 2   Total GAD 7 Score 10 16 21 19   Anxiety Difficulty Somewhat difficult  Extremely difficult Extremely difficult      Review of Systems:   Pertinent items are noted in HPI Denies abnormal vaginal discharge w/ itching/odor/irritation, headaches, visual changes, shortness of breath, chest pain, abdominal pain, severe nausea/vomiting, or problems with urination or bowel movements unless otherwise stated above. Pertinent History  Reviewed:  Reviewed past medical,surgical, social, obstetrical and family history.  Reviewed problem list, medications and allergies. Physical Assessment:   Vitals:   04/27/23 1549  BP: 121/84  Pulse: (!) 134  Weight: 190 lb (86.2 kg)  Body mass index is 31.62 kg/m.        Physical Examination:   General appearance: Well appearing, and in no distress  Mental status: Alert, oriented to person, place, and time  Skin: Warm & dry  Cardiovascular: Normal heart rate noted  Respiratory: Normal respiratory effort, no distress  Abdomen: Soft, gravid, nontender  Pelvic: Cervical exam deferred         Extremities: Edema: None  Fetal Status:     Movement: Present  [place] 20+1 wks,cephalic,anterior placenta gr 0,FHR 161 bpm,normal ovaries,CX 2.9 cm,SVP of fluid 4.3 cm,EFW 342 g 51%,anatomy complete,no obvious abnormalities   Chaperone: N/A No results found for this or any previous visit (from the past 24 hours).  Assessment & Plan:  1) Low-risk pregnancy G1P0000 at [redacted]w[redacted]d with an Estimated Date of Delivery: 09/13/23   2) S/P lap chole 3/21, doing well   Meds: No orders  of the defined types were placed in this encounter.  Labs/procedures today: U/S  Plan:  Continue routine obstetrical care  Next visit: prefers in person    Reviewed: Preterm labor symptoms and general obstetric precautions including but not limited to vaginal bleeding, contractions, leaking of fluid and fetal movement were reviewed in detail with the patient.  All questions were answered. Does have home bp cuff. Office bp cuff given: not applicable. Check bp weekly, let us know if consistently >140 and/or >90.  Follow-up: Return in about 4 weeks (around 05/25/2023) for LROB, CNM, in person.  Future Appointments  Date Time Provider Department Center  05/23/2023  9:30 AM Elsie Lincoln, MD [facility] None  05/25/2023  1:30 PM Arabella Merles, CNM [facility]  06/20/2023  1:50 PM Tommie Sams, DO [facility]    No orders of  the defined types were placed in this encounter.  Cheral Marker CNM, [facility] 04/27/2023 4:10 PM

## 2023-04-27 NOTE — Patient Instructions (Signed)
Toni Moss, thank you for choosing our office today! We appreciate the opportunity to meet your healthcare needs. You may receive a short survey by mail, e-mail, or through [employer]. If you are happy with your care we would appreciate if you could take just a few minutes to complete the survey questions. We read all of your comments and take your feedback very seriously. Thank you again for choosing our office.  Center for [employer] Team at [facility] at [facility] ([address] [place], [place] [address]) Entrance C, located off of [address] Free 24/7 valet parking  Go to [email or web address] to register for FREE online childbirth classes  Call the office [phone]) or go to [facility] if: You begin to severe cramping Your water breaks.  Sometimes it is a big gush of fluid, sometimes it is just a trickle that keeps getting your panties wet or running down your legs You have vaginal bleeding.  It is normal to have a small amount of spotting if your cervix was checked.   [facility] Pediatricians/Family Doctors [facility]): [address]. Colette Ribas, [phone]           [facility]: [address], [phone]                [facility] Medicine [facility]): [address], [phone] (call to ask if accepting patients) [facility] Department: [address], [place], [phone]    [facility] Pediatricians/Family Doctors [facility]): [phone] [address], [phone] [facility]: [address] [place], [phone] [facility] [place]: [address], [phone]  [facility] Doctors  [facility]): [phone] [facility]: [address] [phone]   [facility] Doctors [facility]: [address], [phone]  [facility] Doctors  [facility]: [phone], [phone]  Home Blood Pressure Monitoring for Patients   Your provider has recommended that you check your blood pressure (BP) at least once a week at home. If you do not have a blood pressure cuff at home, one will be provided for you. Contact your provider if you have not received your monitor within 1 week.   Helpful Tips for Accurate Home Blood Pressure Checks  Don't smoke, exercise, or drink caffeine 30 minutes before checking your BP Use the restroom before checking your BP (a full bladder can raise your pressure) Relax in a comfortable upright chair Feet on the ground Left arm resting comfortably on a flat surface at the level of your heart Legs uncrossed Back supported Sit quietly and don't talk Place the cuff on your bare arm Adjust snuggly, so that only two fingertips can fit between your skin and the top of the cuff Check 2 readings separated by at least one minute Keep a log of your BP readings For a visual, please reference this diagram: http://ccnc.care/bpdiagram  Provider Name: [facility]     Phone: [phone]  Zone 1: ALL CLEAR  Continue to monitor your symptoms:  BP reading is less than 140 (top number) or less than 90 (bottom number)  No right upper stomach pain No headaches or seeing spots No feeling nauseated or throwing up No swelling in face and hands  Zone 2: CAUTION Call your doctor's office for any of the following:  BP reading is greater than 140 (top number) or greater than  90 (bottom number)  Stomach pain under your ribs in the middle or right side Headaches or seeing spots Feeling nauseated or throwing up Swelling in face and hands  Zone 3: EMERGENCY  Seek immediate medical care if you have any of the following:  BP reading is greater than160 (top number) or greater than 110 (bottom number) Severe headaches not improving with Tylenol Serious difficulty catching your breath Any worsening symptoms from  Zone 2     Second Trimester of Pregnancy The second trimester is from week 14 through week 27 (months 4 through 6). The second trimester is often a time when you feel your best. Your body has adjusted to being pregnant, and you begin to feel better physically. Usually, morning sickness has lessened or quit completely, you may have more energy, and you may have an increase in appetite. The second trimester is also a time when the fetus is growing rapidly. At the end of the sixth month, the fetus is about 9 inches long and weighs about 1 pounds. You will likely begin to feel the baby move (quickening) between 16 and 20 weeks of pregnancy. Body changes during your second trimester Your body continues to go through many changes during your second trimester. The changes vary from woman to woman. Your weight will continue to increase. You will notice your lower abdomen bulging out. You may begin to get stretch marks on your hips, abdomen, and breasts. You may develop headaches that can be relieved by medicines. The medicines should be approved by your health care provider. You may urinate more often because the fetus is pressing on your bladder. You may develop or continue to have heartburn as a result of your pregnancy. You may develop constipation because certain hormones are causing the muscles that push waste through your intestines to slow down. You may develop hemorrhoids or swollen, bulging veins (varicose veins). You may have back pain. This is caused by: Weight gain. Pregnancy hormones that are relaxing the joints in your pelvis. A shift in weight and the muscles that support your balance. Your breasts will continue to grow and they will continue to become tender. Your gums may bleed and may be sensitive to brushing and flossing. Dark spots or blotches (chloasma, mask of pregnancy) may develop on your face. This will likely fade after the baby is born. A dark line from your belly button to  the pubic area (linea nigra) may appear. This will likely fade after the baby is born. You may have changes in your hair. These can include thickening of your hair, rapid growth, and changes in texture. Some women also have hair loss during or after pregnancy, or hair that feels dry or thin. Your hair will most likely return to normal after your baby is born.  What to expect at prenatal visits During a routine prenatal visit: You will be weighed to make sure you and the fetus are growing normally. Your blood pressure will be taken. Your abdomen will be measured to track your baby's growth. The fetal heartbeat will be listened to. Any test results from the previous visit will be discussed.  Your health care provider may ask you: How you are feeling. If you are feeling the baby move. If you have had any abnormal symptoms, such as leaking fluid, bleeding, severe headaches, or abdominal cramping. If you are using any tobacco products, including cigarettes, chewing tobacco, and electronic cigarettes. If you have any questions.  Other tests that may be performed during   your second trimester include: Blood tests that check for: Low iron levels (anemia). High blood sugar that affects pregnant women (gestational diabetes) between 24 and 28 weeks. Rh antibodies. This is to check for a protein on red blood cells (Rh factor). Urine tests to check for infections, diabetes, or protein in the urine. An ultrasound to confirm the proper growth and development of the baby. An amniocentesis to check for possible genetic problems. Fetal screens for spina bifida and Down syndrome. HIV (human immunodeficiency virus) testing. Routine prenatal testing includes screening for HIV, unless you choose not to have this test.  Follow these instructions at home: Medicines Follow your health care provider's instructions regarding medicine use. Specific medicines may be either safe or unsafe to take during  pregnancy. Take a prenatal vitamin that contains at least 600 micrograms (mcg) of folic acid. If you develop constipation, try taking a stool softener if your health care provider approves. Eating and drinking Eat a balanced diet that includes fresh fruits and vegetables, whole grains, good sources of protein such as meat, eggs, or tofu, and low-fat dairy. Your health care provider will help you determine the amount of weight gain that is right for you. Avoid raw meat and uncooked cheese. These carry germs that can cause birth defects in the baby. If you have low calcium intake from food, talk to your health care provider about whether you should take a daily calcium supplement. Limit foods that are high in fat and processed sugars, such as fried and sweet foods. To prevent constipation: Drink enough fluid to keep your urine clear or pale yellow. Eat foods that are high in fiber, such as fresh fruits and vegetables, whole grains, and beans. Activity Exercise only as directed by your health care provider. Most women can continue their usual exercise routine during pregnancy. Try to exercise for 30 minutes at least 5 days a week. Stop exercising if you experience uterine contractions. Avoid heavy lifting, wear low heel shoes, and practice good posture. A sexual relationship may be continued unless your health care provider directs you otherwise. Relieving pain and discomfort Wear a good support bra to prevent discomfort from breast tenderness. Take warm sitz baths to soothe any pain or discomfort caused by hemorrhoids. Use hemorrhoid cream if your health care provider approves. Rest with your legs elevated if you have leg cramps or low back pain. If you develop varicose veins, wear support hose. Elevate your feet for 15 minutes, 3-4 times a day. Limit salt in your diet. Prenatal Care Write down your questions. Take them to your prenatal visits. Keep all your prenatal visits as told by your health  care provider. This is important. Safety Wear your seat belt at all times when driving. Make a list of emergency phone numbers, including numbers for family, friends, the hospital, and police and fire departments. General instructions Ask your health care provider for a referral to a local prenatal education class. Begin classes no later than the beginning of month 6 of your pregnancy. Ask for help if you have counseling or nutritional needs during pregnancy. Your health care provider can offer advice or refer you to specialists for help with various needs. Do not use hot tubs, steam rooms, or saunas. Do not douche or use tampons or scented sanitary pads. Do not cross your legs for long periods of time. Avoid cat litter boxes and soil used by cats. These carry germs that can cause birth defects in the baby and possibly loss of the   fetus by miscarriage or stillbirth. Avoid all smoking, herbs, alcohol, and unprescribed drugs. Chemicals in these products can affect the formation and growth of the baby. Do not use any products that contain nicotine or tobacco, such as cigarettes and e-cigarettes. If you need help quitting, ask your health care provider. Visit your dentist if you have not gone yet during your pregnancy. Use a soft toothbrush to brush your teeth and be gentle when you floss. Contact a health care provider if: You have dizziness. You have mild pelvic cramps, pelvic pressure, or nagging pain in the abdominal area. You have persistent nausea, vomiting, or diarrhea. You have a bad smelling vaginal discharge. You have pain when you urinate. Get help right away if: You have a fever. You are leaking fluid from your vagina. You have spotting or bleeding from your vagina. You have severe abdominal cramping or pain. You have rapid weight gain or weight loss. You have shortness of breath with chest pain. You notice sudden or extreme swelling of your face, hands, ankles, feet, or legs. You  have not felt your baby move in over an hour. You have severe headaches that do not go away when you take medicine. You have vision changes. Summary The second trimester is from week 14 through week 27 (months 4 through 6). It is also a time when the fetus is growing rapidly. Your body goes through many changes during pregnancy. The changes vary from woman to woman. Avoid all smoking, herbs, alcohol, and unprescribed drugs. These chemicals affect the formation and growth your baby. Do not use any tobacco products, such as cigarettes, chewing tobacco, and e-cigarettes. If you need help quitting, ask your health care provider. Contact your health care provider if you have any questions. Keep all prenatal visits as told by your health care provider. This is important. This information is not intended to replace advice given to you by your health care provider. Make sure you discuss any questions you have with your health care provider. Document Released: 12/29/2000 Document Revised: 06/12/2015 Document Reviewed: 03/07/2012 Elsevier Interactive Patient Education  2017 Elsevier Inc.  

## 2023-04-30 DIAGNOSIS — Z419 Encounter for procedure for purposes other than remedying health state, unspecified: Secondary | ICD-10-CM | POA: Diagnosis not present

## 2023-05-11 ENCOUNTER — Encounter (HOSPITAL_COMMUNITY): Payer: Self-pay

## 2023-05-11 ENCOUNTER — Ambulatory Visit (HOSPITAL_COMMUNITY)
Admission: RE | Admit: 2023-05-11 | Discharge: 2023-05-11 | Disposition: A | Discharge status: Home / Self Care | Source: Ambulatory Visit | Attending: Physician Assistant | Admitting: Physician Assistant

## 2023-05-11 DIAGNOSIS — E041 Nontoxic single thyroid nodule: Secondary | ICD-10-CM | POA: Diagnosis present

## 2023-05-11 MED ORDER — LIDOCAINE HCL (PF) 2 % IJ SOLN
INTRAMUSCULAR | Status: AC
  Filled 2023-05-11: qty 10

## 2023-05-11 MED ORDER — LIDOCAINE HCL (PF) 2 % IJ SOLN
10.0000 mL | Freq: Once | INTRAMUSCULAR | Status: AC
  Administered 2023-05-11: 10 mL via INTRADERMAL

## 2023-05-13 ENCOUNTER — Encounter: Payer: Self-pay | Admitting: Physician Assistant

## 2023-05-13 LAB — CYTOLOGY - NON PAP

## 2023-05-19 ENCOUNTER — Telehealth: Payer: Self-pay | Admitting: Family Medicine

## 2023-05-19 ENCOUNTER — Other Ambulatory Visit: Payer: Self-pay | Admitting: Physician Assistant

## 2023-05-19 DIAGNOSIS — E041 Nontoxic single thyroid nodule: Secondary | ICD-10-CM

## 2023-05-19 DIAGNOSIS — R09A2 Foreign body sensation, throat: Secondary | ICD-10-CM

## 2023-05-19 NOTE — Telephone Encounter (Signed)
 A telephone message was sent to the GYN docs to get their input regarding T4 levels with pregnancy and her symptoms  When we get a reply we will respond to the patient

## 2023-05-19 NOTE — Telephone Encounter (Signed)
 [facility] is a mutual patient.  She has been concerned about her heart rate running fast approximately 110 she also recently was evaluated by our office for a thyroid  nodule for which the fine-needle aspiration biopsy was negative.  She is being referred to ENT because of her persistent feeling of something in her throat  My main issue in question for your group-her blood work shows a TSH of 1.016, her T4 is 18.7. I know that within pregnancy T4 can run a high but I did not know what the upper limits of normal for pregnancy is. Trying to decide if she has been mild hyperthyroidism or in fact has normal numbers for pregnancy.  Obviously if this is abnormal I will want to refer her to endocrinology.  If it is normal we can give her reassurance.  Please send me a response regarding from a OB/GYN standpoint during pregnancy do we feel that these numbers are normal or not  Thank you so much for your help-look forward to your reply-[facility]

## 2023-05-23 ENCOUNTER — Encounter: Payer: Self-pay | Admitting: Family Medicine

## 2023-05-23 ENCOUNTER — Telehealth (HOSPITAL_COMMUNITY): Admitting: Psychiatry

## 2023-05-23 ENCOUNTER — Encounter (HOSPITAL_COMMUNITY): Payer: Self-pay | Admitting: Psychiatry

## 2023-05-23 DIAGNOSIS — F3341 Major depressive disorder, recurrent, in partial remission: Secondary | ICD-10-CM

## 2023-05-23 DIAGNOSIS — F603 Borderline personality disorder: Secondary | ICD-10-CM

## 2023-05-23 DIAGNOSIS — O99342 Other mental disorders complicating pregnancy, second trimester: Secondary | ICD-10-CM | POA: Diagnosis not present

## 2023-05-23 DIAGNOSIS — F5021 Bulimia nervosa, mild: Secondary | ICD-10-CM | POA: Diagnosis not present

## 2023-05-23 DIAGNOSIS — Z3A24 24 weeks gestation of pregnancy: Secondary | ICD-10-CM

## 2023-05-23 MED ORDER — LAMOTRIGINE 25 MG PO TABS: 25.0000 mg | ORAL_TABLET | Freq: Every evening | ORAL | 1 refills | Status: DC

## 2023-05-23 NOTE — Telephone Encounter (Signed)
 I have sent the patient a MyChart message reminding her of all of this depending on her response may or may not need to get [facility] cardiology involved

## 2023-05-23 NOTE — Patient Instructions (Signed)
 We did not make any medication changes today.  In order to find a new psychiatrist local options would be [facility] in [place] and [facility] in [place].  If you wanted to stay with [place] there is a clinic in [place] and one in [place] on the [address] that you should be able to connect with.  Please ask your OB or primary care provider about a nutrition referral and keep trying to get connected with a therapist.

## 2023-05-23 NOTE — Telephone Encounter (Signed)
 See other telephone message, MyChart message sent to patient, she will let us  know if she needs to see [facility] cardiology
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
## 2023-05-25 ENCOUNTER — Encounter: Payer: Self-pay | Admitting: Advanced Practice Midwife

## 2023-05-25 ENCOUNTER — Ambulatory Visit: Admitting: Advanced Practice Midwife

## 2023-05-25 VITALS — BP 125/85 | HR 134 | Wt 191.4 lb

## 2023-05-25 DIAGNOSIS — Z3A24 24 weeks gestation of pregnancy: Secondary | ICD-10-CM | POA: Diagnosis not present

## 2023-05-25 DIAGNOSIS — R000 Tachycardia, unspecified: Secondary | ICD-10-CM | POA: Diagnosis not present

## 2023-05-25 DIAGNOSIS — Z3402 Encounter for supervision of normal first pregnancy, second trimester: Secondary | ICD-10-CM | POA: Diagnosis not present

## 2023-05-25 NOTE — Progress Notes (Signed)
   LOW-RISK PREGNANCY VISIT Patient name: Toni Moss MRN [id number]  Date of birth: [birth year] Chief Complaint:   Routine Prenatal Visit  History of Present Illness:   Toni Moss is a 23 y.o. G47P0000 female at [redacted]w[redacted]d with an Estimated Date of Delivery: 09/13/23 being seen today for ongoing management of a low-risk pregnancy.  Today she reports  doing well s/p lab chole 3/21; had thyroid  nodule w benign fine-needle aspiration bx; still having tachycardia   . Contractions: Not present. Vag. Bleeding: None.  Movement: Present. denies leaking of fluid. Review of Systems:   Pertinent items are noted in HPI Denies abnormal vaginal discharge w/ itching/odor/irritation, headaches, visual changes, shortness of breath, chest pain, abdominal pain, severe nausea/vomiting, or problems with urination or bowel movements unless otherwise stated above. Pertinent History Reviewed:  Reviewed past medical,surgical, social, obstetrical and family history.  Reviewed problem list, medications and allergies. Physical Assessment:   Vitals:   05/25/23 1338  BP: 125/85  Pulse: (!) 134  Weight: 191 lb 6.4 oz (86.8 kg)  Body mass index is 31.85 kg/m.        Physical Examination:   General appearance: Well appearing, and in no distress  Mental status: Alert, oriented to person, place, and time  Skin: Warm & dry  Cardiovascular: Normal heart rate noted  Respiratory: Normal respiratory effort, no distress  Abdomen: Soft, gravid, nontender  Pelvic: Cervical exam deferred         Extremities: Edema: None  Fetal Status: Fetal Heart Rate (bpm): 156 Fundal Height: 26 cm Movement: Present    No results found for this or any previous visit (from the past 24 hours).  Assessment & Plan:  1) Low-risk pregnancy G1P0000 at [redacted]w[redacted]d with an Estimated Date of Delivery: 09/13/23   2) Mat tachycardia, order to see Dr Emmette Harms since she has had persistent tachycardia this preg  3) Rh neg, plan Rhogam ~30wks  4)  Anx/dep/personality DO, stable, seeing psychiatry and taking Lamictal    Meds: No orders of the defined types were placed in this encounter.  Labs/procedures today: none  Plan:  Continue routine obstetrical care   Reviewed: Preterm labor symptoms and general obstetric precautions including but not limited to vaginal bleeding, contractions, leaking of fluid and fetal movement were reviewed in detail with the patient.  All questions were answered. Has home bp cuff. Check bp weekly, let us  know if >140/90.   Follow-up: Return in about 3 weeks (around 06/15/2023) for LROB, PN2.  Orders Placed This Encounter  Procedures   AMB Referral to Cardio Obstetrics   Jolayne Natter CNM 05/25/2023 2:00 PM

## 2023-05-25 NOTE — Patient Instructions (Signed)
 Toni Moss, I greatly value your feedback.  If you receive a survey following your visit with us  today, we appreciate you taking the time to fill it out.  Thanks, Toni Moss, CNM   You will have your sugar test next visit.  Please do not eat or drink anything after midnight the night before you come, not even water .  You will be here for at least two hours.  Please make an appointment online for the bloodwork at [email or web address] for 8:30am (or as close to this as possible). Make sure you select the [facility] service center. The day of the appointment, check in with our office first, then you will go to [facility] to start the sugar test.    [facility] HAS MOVED!!! It is now [facility] ([address] [place], [place] [address]) Entrance C, located off of [address] valet parking  Go to [email or web address] to register for FREE online childbirth classes   Call the office [phone]) or go to [facility] if: You begin to have strong, frequent contractions Your water  breaks.  Sometimes it is a big gush of fluid, sometimes it is just a trickle that keeps getting your panties wet or running down your legs You have vaginal bleeding.  It is normal to have a small amount of spotting if your cervix was checked.  You don't feel your baby moving like normal.  If you don't, get you something to eat and drink and lay down and focus on feeling your baby move.   If your baby is still not moving like normal, you should call the office or go to [facility].  [place] Pediatricians/Family Doctors: [facility] [phone]           [facility] [phone]                [facility] [phone] (usually not accepting new patients unless you have family there already, you are always welcome to call and ask)      [facility] Department [phone]       [facility] Pediatricians/Family Doctors:  [facility]: [phone] [facility]: [phone] [facility]: [phone]  [facility] Doctors:  [facility]: [phone]  [facility]: [phone]  [facility] Doctors: [facility]: [phone]   Home Blood Pressure Monitoring for Patients   Your provider has recommended that you check your blood pressure (BP) at least once a week at home. If you do not have a blood pressure cuff at home, one will be provided for you. Contact your provider if you have not received your monitor within 1 week.   Helpful Tips for Accurate Home Blood Pressure Checks  Don't smoke, exercise, or drink caffeine 30 minutes before checking your BP Use the restroom before checking your BP (a full bladder can raise your pressure) Relax in a comfortable upright chair Feet on the ground Left arm resting comfortably on a flat surface at the level of your heart Legs uncrossed Back supported Sit quietly and don't talk Place the cuff on your bare arm Adjust snuggly, so that only two fingertips can fit between your skin and the top of the cuff Check 2 readings separated by at least one minute Keep a log of your BP readings For a visual, please reference this diagram: http://ccnc.care/bpdiagram  Provider Name: [facility]     Phone: [phone]  Zone 1: ALL CLEAR  Continue to monitor your symptoms:  BP reading is less than 140 (top number) or less than 90 (bottom number)  No right upper stomach pain No headaches or seeing spots No feeling nauseated or throwing up No swelling in face and hands  Zone 2: CAUTION Call your doctor's office for any of the following:  BP reading is greater than 140 (top number) or greater than 90 (bottom number)  Stomach pain under your ribs in the middle or right side Headaches or seeing spots Feeling nauseated or throwing up Swelling in face and hands  Zone 3: EMERGENCY  Seek  immediate medical care if you have any of the following:  BP reading is greater than160 (top number) or greater than 110 (bottom number) Severe headaches not improving with Tylenol  Serious difficulty catching your breath Any worsening symptoms from Zone 2   Second Trimester of Pregnancy The second trimester is from week 13 through week 28, months 4 through 6. The second trimester is often a time when you feel your best. Your body has also adjusted to being pregnant, and you begin to feel better physically. Usually, morning sickness has lessened or quit completely, you may have more energy, and you may have an increase in appetite. The second trimester is also a time when the fetus is growing rapidly. At the end of the sixth month, the fetus is about 9 inches long and weighs about 1 pounds. You will likely begin to feel the baby move (quickening) between 18 and 20 weeks of the pregnancy. BODY CHANGES Your body goes through many changes during pregnancy. The changes vary from woman to woman.  Your weight will continue to increase. You will notice your lower abdomen bulging out. You may begin to get stretch marks on your hips, abdomen, and breasts. You may develop headaches that can be relieved by medicines approved by your health care provider. You may urinate more often because the fetus is pressing on your bladder. You may develop or continue to have heartburn as a result of your pregnancy. You may develop constipation because certain hormones are causing the muscles that push waste through your intestines to slow down. You may develop hemorrhoids or swollen, bulging veins (varicose veins). You may have back pain because of the weight gain and pregnancy hormones relaxing your joints between the bones in your pelvis and as a result of a shift in weight and the muscles that support your balance. Your breasts will continue to grow and be tender. Your gums may bleed and may be sensitive to brushing  and flossing. Dark spots or blotches (chloasma, mask of pregnancy) may develop on your face. This will likely fade after the baby is born. A dark line from your belly button to the pubic area (linea nigra) may appear. This will likely fade after the baby is born. You may have changes in your hair. These can include thickening of your hair, rapid growth, and changes in texture. Some women also have hair loss during or after pregnancy, or hair that feels dry or thin. Your hair will most likely return to normal after your baby is born. WHAT TO EXPECT AT YOUR PRENATAL VISITS During a routine prenatal visit: You will be weighed to make sure you and the fetus are growing normally. Your blood pressure will be taken. Your abdomen will be measured to track your baby's growth. The fetal heartbeat will be listened to. Any test results from the previous visit will be discussed. Your health care provider  may ask you: How you are feeling. If you are feeling the baby move. If you have had any abnormal symptoms, such as leaking fluid, bleeding, severe headaches, or abdominal cramping. If you have any questions. Other tests that may be performed during your second trimester include: Blood tests that check for: Low iron levels (anemia). Gestational diabetes (between 24 and 28 weeks). Rh antibodies. Urine tests to check for infections, diabetes, or protein in the urine. An ultrasound to confirm the proper growth and development of the baby. An amniocentesis to check for possible genetic problems. Fetal screens for spina bifida and Down syndrome. HOME CARE INSTRUCTIONS  Avoid all smoking, herbs, alcohol, and unprescribed drugs. These chemicals affect the formation and growth of the baby. Follow your health care provider's instructions regarding medicine use. There are medicines that are either safe or unsafe to take during pregnancy. Exercise only as directed by your health care provider. Experiencing  uterine cramps is a good sign to stop exercising. Continue to eat regular, healthy meals. Wear a good support bra for breast tenderness. Do not use hot tubs, steam rooms, or saunas. Wear your seat belt at all times when driving. Avoid raw meat, uncooked cheese, cat litter boxes, and soil used by cats. These carry germs that can cause birth defects in the baby. Take your prenatal vitamins. Try taking a stool softener (if your health care provider approves) if you develop constipation. Eat more high-fiber foods, such as fresh vegetables or fruit and whole grains. Drink plenty of fluids to keep your urine clear or pale yellow. Take warm sitz baths to soothe any pain or discomfort caused by hemorrhoids. Use hemorrhoid cream if your health care provider approves. If you develop varicose veins, wear support hose. Elevate your feet for 15 minutes, 3-4 times a day. Limit salt in your diet. Avoid heavy lifting, wear low heel shoes, and practice good posture. Rest with your legs elevated if you have leg cramps or low back pain. Visit your dentist if you have not gone yet during your pregnancy. Use a soft toothbrush to brush your teeth and be gentle when you floss. A sexual relationship may be continued unless your health care provider directs you otherwise. Continue to go to all your prenatal visits as directed by your health care provider. SEEK MEDICAL CARE IF:  You have dizziness. You have mild pelvic cramps, pelvic pressure, or nagging pain in the abdominal area. You have persistent nausea, vomiting, or diarrhea. You have a bad smelling vaginal discharge. You have pain with urination. SEEK IMMEDIATE MEDICAL CARE IF:  You have a fever. You are leaking fluid from your vagina. You have spotting or bleeding from your vagina. You have severe abdominal cramping or pain. You have rapid weight gain or loss. You have shortness of breath with chest pain. You notice sudden or extreme swelling of your face,  hands, ankles, feet, or legs. You have not felt your baby move in over an hour. You have severe headaches that do not go away with medicine. You have vision changes. Document Released: 12/29/2000 Document Revised: 01/09/2013 Document Reviewed: 03/07/2012 [facility] Patient Information 2015 [place], [place]. This information is not intended to replace advice given to you by your health care provider. Make sure you discuss any questions you have with your health care provider.

## 2023-05-30 DIAGNOSIS — Z419 Encounter for procedure for purposes other than remedying health state, unspecified: Secondary | ICD-10-CM | POA: Diagnosis not present

## 2023-06-16 ENCOUNTER — Other Ambulatory Visit: Payer: Self-pay

## 2023-06-16 ENCOUNTER — Ambulatory Visit: Admitting: Women's Health

## 2023-06-16 ENCOUNTER — Ambulatory Visit: Attending: Cardiology

## 2023-06-16 ENCOUNTER — Other Ambulatory Visit

## 2023-06-16 ENCOUNTER — Encounter: Payer: Self-pay | Admitting: Women's Health

## 2023-06-16 VITALS — BP 115/85 | HR 137 | Wt 194.6 lb

## 2023-06-16 DIAGNOSIS — Z1332 Encounter for screening for maternal depression: Secondary | ICD-10-CM

## 2023-06-16 DIAGNOSIS — R000 Tachycardia, unspecified: Secondary | ICD-10-CM

## 2023-06-16 DIAGNOSIS — Z3689 Encounter for other specified antenatal screening: Secondary | ICD-10-CM

## 2023-06-16 DIAGNOSIS — Z3402 Encounter for supervision of normal first pregnancy, second trimester: Secondary | ICD-10-CM

## 2023-06-16 DIAGNOSIS — Z3A27 27 weeks gestation of pregnancy: Secondary | ICD-10-CM

## 2023-06-16 DIAGNOSIS — Z23 Encounter for immunization: Secondary | ICD-10-CM

## 2023-06-16 DIAGNOSIS — Z131 Encounter for screening for diabetes mellitus: Secondary | ICD-10-CM

## 2023-06-16 MED ORDER — PROPRANOLOL HCL 10 MG PO TABS: 10.0000 mg | ORAL_TABLET | Freq: Every day | ORAL | 3 refills | Status: DC

## 2023-06-16 NOTE — Progress Notes (Signed)
 LOW-RISK PREGNANCY VISIT Patient name: Toni Moss MRN [id number]  Date of birth: February 14, 2000 Chief Complaint:   Routine Prenatal Visit  History of Present Illness:   Toni Moss is a 23 y.o. G66P0000 female at [redacted]w[redacted]d with an Estimated Date of Delivery: 09/13/23 being seen today for ongoing management of a low-risk pregnancy.   Today she reports no complaints. Made appt w/ Dr. Emmette Harms, can't get in until July. No sx w/ tachycardia. States she's had tachycardia for years, never been on meds. Contractions: Not present. Vag. Bleeding: None.  Movement: Present. denies leaking of fluid.     06/16/2023    9:20 AM 03/30/2023    9:34 AM 03/02/2023    2:24 PM 12/20/2022    2:45 PM 12/20/2022    1:14 PM  Depression screen PHQ 2/9  Decreased Interest 1 1 2 1 3   Down, Depressed, Hopeless 1 1 2 1 3   PHQ - 2 Score 2 2 4 2 6   Altered sleeping 1 2 2 1 3   Tired, decreased energy 1 2 2 3 3   Change in appetite 0 2 2 2 3   Feeling bad or failure about yourself  0 0 2 0 3  Trouble concentrating 0 3 2 3 3   Moving slowly or fidgety/restless 0 3 2  3   Suicidal thoughts 0 0 0 0 0  PHQ-9 Score 4 14 16 11 24   Difficult doing work/chores  Somewhat difficult  Very difficult         06/16/2023    9:21 AM 03/30/2023    9:35 AM 03/02/2023    2:24 PM 12/20/2022    1:15 PM  GAD 7 : Generalized Anxiety Score  Nervous, Anxious, on Edge 1 2 2 3   Control/stop worrying 1 1 2 3   Worry too much - different things 1 1 2 3   Trouble relaxing 0 1 2 3   Restless 0 2 2 3   Easily annoyed or irritable 1 2 3 3   Afraid - awful might happen 0 1 3 3   Total GAD 7 Score 4 10 16 21   Anxiety Difficulty  Somewhat difficult  Extremely difficult      Review of Systems:   Pertinent items are noted in HPI Denies abnormal vaginal discharge w/ itching/odor/irritation, headaches, visual changes, shortness of breath, chest pain, abdominal pain, severe nausea/vomiting, or problems with urination or bowel movements unless otherwise stated  above. Pertinent History Reviewed:  Reviewed past medical,surgical, social, obstetrical and family history.  Reviewed problem list, medications and allergies. Physical Assessment:   Vitals:   06/16/23 0917  BP: 115/85  Pulse: (!) 137  Weight: 194 lb 9.6 oz (88.3 kg)  Body mass index is 32.38 kg/m.        Physical Examination:   General appearance: Well appearing, and in no distress  Mental status: Alert, oriented to person, place, and time  Skin: Warm & dry  Cardiovascular: Normal heart rate noted  Respiratory: Normal respiratory effort, no distress  Abdomen: Soft, gravid, nontender  Pelvic: Cervical exam deferred         Extremities:    Fetal Status: Fetal Heart Rate (bpm): 160   Movement: Present    Chaperone: N/A No results found for this or any previous visit (from the past 24 hours).  Assessment & Plan:  1) Low-risk pregnancy G1P0000 at [redacted]w[redacted]d with an Estimated Date of Delivery: 09/13/23   2) Tachycardia, for years, asymptomatic, never on meds, EKG in Feb at [facility] sinus tach, ST abnormalities,  has been 118-162 here during pregnancy, most recently 130s, has appt w/ Dr. Emmette Harms 7/18. Sent note to Dr. 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    Dispense:  30 tablet    Refill:  3   Labs/procedures today: Tdap and PN2  Plan:  Continue routine obstetrical care  Next visit: prefers in person    Reviewed: Preterm labor symptoms and general obstetric precautions including but not limited to vaginal bleeding, contractions, leaking of fluid and fetal movement were reviewed in detail with the patient.  All questions were answered. Does have home bp cuff. Office bp cuff given: not applicable. Check bp  weekly, let us  know if consistently >140 and/or >90.  Follow-up: Return in about 3 weeks (around 07/07/2023) for LROB, US :EFW, MD, in person.  Future Appointments  Date Time Provider Department Center  06/20/2023  1:50 PM Cook, Jayce G, DO [facility]  07/07/2023 11:10 AM Ferd Householder, CNM [facility]  07/16/2023 11:30 AM Bahraini, Genevive Ket [facility] None  08/05/2023  2:20 PM Tobb, Kardie, DO [facility] None    Orders Placed This Encounter  Procedures   US  OB Follow Up   Tdap vaccine greater than or equal to 7yo IM   Ferd Householder CNM, [facility] 06/16/2023 11:21 AM

## 2023-06-16 NOTE — Progress Notes (Signed)
 7 day Zio monitor ordered per Dr. Ryan Coyer request.

## 2023-06-16 NOTE — Progress Notes (Unsigned)
 Enrolled patient for a 7 day Zio XT monitor to be mailed to patients home.

## 2023-06-16 NOTE — Patient Instructions (Signed)
 Tavia, thank you for choosing our office today! We appreciate the opportunity to meet your healthcare needs. You may receive a short survey by mail, e-mail, or through [employer]. If you are happy with your care we would appreciate if you could take just a few minutes to complete the survey questions. We read all of your comments and take your feedback very seriously. Thank you again for choosing our office.  Center for [employer] Team at [facility]  [facility] at [facility] ([address] [place], [place] [address]) Entrance C, located off of [address] Free 24/7 valet parking   CLASSES: Go to [email or web address] to register for classes (childbirth, breastfeeding, waterbirth, infant CPR, daddy bootcamp, etc.)  Call the office [phone]) or go to [facility] if: You begin to have strong, frequent contractions Your water breaks.  Sometimes it is a big gush of fluid, sometimes it is just a trickle that keeps getting your panties wet or running down your legs You have vaginal bleeding.  It is normal to have a small amount of spotting if your cervix was checked.  You don't feel your baby moving like normal.  If you don't, get you something to eat and drink and lay down and focus on feeling your baby move.   If your baby is still not moving like normal, you should call the office or go to [facility].  Call the office [phone]) or go to [facility] hospital for these signs of pre-eclampsia: Severe headache that does not go away with Tylenol Visual changes- seeing spots, double, blurred vision Pain under your right breast or upper abdomen that does not go away with Tums or heartburn medicine Nausea and/or vomiting Severe swelling in your hands, feet, and face   Tdap Vaccine It is recommended that you get the Tdap vaccine during the third trimester of EACH pregnancy to help protect your baby from getting pertussis (whooping cough) 27-36 weeks is the BEST time to do  this so that you can pass the protection on to your baby. During pregnancy is better than after pregnancy, but if you are unable to get it during pregnancy it will be offered at the hospital.  You can get this vaccine with [place], at the health department, your family doctor, or some local pharmacies Everyone who will be around your baby should also be up-to-date on their vaccines before the baby comes. Adults (who are not pregnant) only need 1 dose of Tdap during adulthood.   [facility] Pediatricians/Family Doctors [facility]): [address] Colette Ribas, [phone]           [facility]: [address], [phone]                [facility] Medicine [facility]): [address], [phone] (call to ask if accepting patients) [facility] Department: [address], [place], [phone]    [facility] Pediatricians/Family Doctors [facility]): [phone] [address], [phone] [facility]: [address] [place], [phone] [facility]: [address], [phone]  [facility] Doctors  [facility]): [phone] [facility]: [address] [phone]   [facility] Doctors [facility]: [address] [phone]  [facility] Family Doctors  [facility]: [phone], [phone]  Home Blood Pressure Monitoring for Patients   Your provider has recommended that you check your  blood pressure (BP) at least once a week at home. If you do not have a blood pressure cuff at home, one will be provided for you. Contact your provider if you have not received your monitor within 1 week.   Helpful Tips for Accurate Home Blood Pressure Checks  Don't smoke, exercise, or drink caffeine 30 minutes before checking your BP Use the restroom before checking your BP (a full bladder can raise your  pressure) Relax in a comfortable upright chair Feet on the ground Left arm resting comfortably on a flat surface at the level of your heart Legs uncrossed Back supported Sit quietly and don't talk Place the cuff on your bare arm Adjust snuggly, so that only two fingertips can fit between your skin and the top of the cuff Check 2 readings separated by at least one minute Keep a log of your BP readings For a visual, please reference this diagram: http://ccnc.care/bpdiagram  Provider Name: [facility]     Phone: [phone]  Zone 1: ALL CLEAR  Continue to monitor your symptoms:  BP reading is less than 140 (top number) or less than 90 (bottom number)  No right upper stomach pain No headaches or seeing spots No feeling nauseated or throwing up No swelling in face and hands  Zone 2: CAUTION Call your doctor's office for any of the following:  BP reading is greater than 140 (top number) or greater than 90 (bottom number)  Stomach pain under your ribs in the middle or right side Headaches or seeing spots Feeling nauseated or throwing up Swelling in face and hands  Zone 3: EMERGENCY  Seek immediate medical care if you have any of the following:  BP reading is greater than160 (top number) or greater than 110 (bottom number) Severe headaches not improving with Tylenol Serious difficulty catching your breath Any worsening symptoms from Zone 2   Third Trimester of Pregnancy The third trimester is from week 29 through week 42, months 7 through 9. The third trimester is a time when the fetus is growing rapidly. At the end of the ninth month, the fetus is about 20 inches in length and weighs 6-10 pounds.  BODY CHANGES Your body goes through many changes during pregnancy. The changes vary from woman to woman.  Your weight will continue to increase. You can expect to gain 25-35 pounds (11-16 kg) by the end of the pregnancy. You may begin to get stretch marks on your hips, abdomen,  and breasts. You may urinate more often because the fetus is moving lower into your pelvis and pressing on your bladder. You may develop or continue to have heartburn as a result of your pregnancy. You may develop constipation because certain hormones are causing the muscles that push waste through your intestines to slow down. You may develop hemorrhoids or swollen, bulging veins (varicose veins). You may have pelvic pain because of the weight gain and pregnancy hormones relaxing your joints between the bones in your pelvis. Backaches may result from overexertion of the muscles supporting your posture. You may have changes in your hair. These can include thickening of your hair, rapid growth, and changes in texture. Some women also have hair loss during or after pregnancy, or hair that feels dry or thin. Your hair will most likely return to normal after your baby is born. Your breasts will continue to grow and be tender. A yellow discharge may leak from your breasts called colostrum. Your belly button may stick out. You may  feel short of breath because of your expanding uterus. You may notice the fetus "dropping," or moving lower in your abdomen. You may have a bloody mucus discharge. This usually occurs a few days to a week before labor begins. Your cervix becomes thin and soft (effaced) near your due date. WHAT TO EXPECT AT YOUR PRENATAL EXAMS  You will have prenatal exams every 2 weeks until week 36. Then, you will have weekly prenatal exams. During a routine prenatal visit: You will be weighed to make sure you and the fetus are growing normally. Your blood pressure is taken. Your abdomen will be measured to track your baby's growth. The fetal heartbeat will be listened to. Any test results from the previous visit will be discussed. You may have a cervical check near your due date to see if you have effaced. At around 36 weeks, your caregiver will check your cervix. At the same time, your  caregiver will also perform a test on the secretions of the vaginal tissue. This test is to determine if a type of bacteria, Group B streptococcus, is present. Your caregiver will explain this further. Your caregiver may ask you: What your birth plan is. How you are feeling. If you are feeling the baby move. If you have had any abnormal symptoms, such as leaking fluid, bleeding, severe headaches, or abdominal cramping. If you have any questions. Other tests or screenings that may be performed during your third trimester include: Blood tests that check for low iron levels (anemia). Fetal testing to check the health, activity level, and growth of the fetus. Testing is done if you have certain medical conditions or if there are problems during the pregnancy. FALSE LABOR You may feel small, irregular contractions that eventually go away. These are called Braxton Hicks contractions, or false labor. Contractions may last for hours, days, or even weeks before true labor sets in. If contractions come at regular intervals, intensify, or become painful, it is best to be seen by your caregiver.  SIGNS OF LABOR  Menstrual-like cramps. Contractions that are 5 minutes apart or less. Contractions that start on the top of the uterus and spread down to the lower abdomen and back. A sense of increased pelvic pressure or back pain. A watery or bloody mucus discharge that comes from the vagina. If you have any of these signs before the 37th week of pregnancy, call your caregiver right away. You need to go to the hospital to get checked immediately. HOME CARE INSTRUCTIONS  Avoid all smoking, herbs, alcohol, and unprescribed drugs. These chemicals affect the formation and growth of the baby. Follow your caregiver's instructions regarding medicine use. There are medicines that are either safe or unsafe to take during pregnancy. Exercise only as directed by your caregiver. Experiencing uterine cramps is a good sign to  stop exercising. Continue to eat regular, healthy meals. Wear a good support bra for breast tenderness. Do not use hot tubs, steam rooms, or saunas. Wear your seat belt at all times when driving. Avoid raw meat, uncooked cheese, cat litter boxes, and soil used by cats. These carry germs that can cause birth defects in the baby. Take your prenatal vitamins. Try taking a stool softener (if your caregiver approves) if you develop constipation. Eat more high-fiber foods, such as fresh vegetables or fruit and whole grains. Drink plenty of fluids to keep your urine clear or pale yellow. Take warm sitz baths to soothe any pain or discomfort caused by hemorrhoids. Use hemorrhoid cream if  your caregiver approves. If you develop varicose veins, wear support hose. Elevate your feet for 15 minutes, 3-4 times a day. Limit salt in your diet. Avoid heavy lifting, wear low heal shoes, and practice good posture. Rest a lot with your legs elevated if you have leg cramps or low back pain. Visit your dentist if you have not gone during your pregnancy. Use a soft toothbrush to brush your teeth and be gentle when you floss. A sexual relationship may be continued unless your caregiver directs you otherwise. Do not travel far distances unless it is absolutely necessary and only with the approval of your caregiver. Take prenatal classes to understand, practice, and ask questions about the labor and delivery. Make a trial run to the hospital. Pack your hospital bag. Prepare the baby's nursery. Continue to go to all your prenatal visits as directed by your caregiver. SEEK MEDICAL CARE IF: You are unsure if you are in labor or if your water has broken. You have dizziness. You have mild pelvic cramps, pelvic pressure, or nagging pain in your abdominal area. You have persistent nausea, vomiting, or diarrhea. You have a bad smelling vaginal discharge. You have pain with urination. SEEK IMMEDIATE MEDICAL CARE IF:  You  have a fever. You are leaking fluid from your vagina. You have spotting or bleeding from your vagina. You have severe abdominal cramping or pain. You have rapid weight loss or gain. You have shortness of breath with chest pain. You notice sudden or extreme swelling of your face, hands, ankles, feet, or legs. You have not felt your baby move in over an hour. You have severe headaches that do not go away with medicine. You have vision changes. Document Released: 12/29/2000 Document Revised: 01/09/2013 Document Reviewed: 03/07/2012 [facility] Patient Information 2015 [place], [place]. This information is not intended to replace advice given to you by your health care provider. Make sure you discuss any questions you have with your health care provider.

## 2023-06-18 LAB — GLUCOSE TOLERANCE, 2 HOURS W/ 1HR
Glucose, 1 hour: 123 mg/dL (ref 70–179)
Glucose, 2 hour: 95 mg/dL (ref 70–152)
Glucose, Fasting: 79 mg/dL (ref 70–91)

## 2023-06-18 LAB — CBC
Hematocrit: 35.6 % (ref 34.0–46.6)
Hemoglobin: 11.6 g/dL (ref 11.1–15.9)
MCH: 31 pg (ref 26.6–33.0)
MCHC: 32.6 g/dL (ref 31.5–35.7)
MCV: 95 fL (ref 79–97)
Platelets: 351 10*3/uL (ref 150–450)
RBC: 3.74 x10E6/uL — ABNORMAL LOW (ref 3.77–5.28)
RDW: 11.9 % (ref 11.7–15.4)
WBC: 16.9 10*3/uL — ABNORMAL HIGH (ref 3.4–10.8)

## 2023-06-18 LAB — HIV ANTIBODY (ROUTINE TESTING W REFLEX): HIV Screen 4th Generation wRfx: NONREACTIVE

## 2023-06-18 LAB — ANTIBODY SCREEN: Antibody Screen: NEGATIVE

## 2023-06-18 LAB — RPR: RPR Ser Ql: NONREACTIVE

## 2023-06-20 ENCOUNTER — Ambulatory Visit: Payer: Self-pay | Admitting: Women's Health

## 2023-06-20 ENCOUNTER — Ambulatory Visit: Payer: Managed Care, Other (non HMO) | Admitting: Family Medicine

## 2023-06-20 DIAGNOSIS — Z3402 Encounter for supervision of normal first pregnancy, second trimester: Secondary | ICD-10-CM

## 2023-06-30 DIAGNOSIS — Z419 Encounter for procedure for purposes other than remedying health state, unspecified: Secondary | ICD-10-CM | POA: Diagnosis not present

## 2023-07-07 ENCOUNTER — Encounter: Payer: Self-pay | Admitting: Women's Health

## 2023-07-07 ENCOUNTER — Ambulatory Visit: Admitting: Women's Health

## 2023-07-07 VITALS — BP 117/80 | HR 121 | Wt 198.0 lb

## 2023-07-07 DIAGNOSIS — Z3402 Encounter for supervision of normal first pregnancy, second trimester: Secondary | ICD-10-CM

## 2023-07-07 DIAGNOSIS — Z6791 Unspecified blood type, Rh negative: Secondary | ICD-10-CM

## 2023-07-07 DIAGNOSIS — O26893 Other specified pregnancy related conditions, third trimester: Secondary | ICD-10-CM

## 2023-07-07 DIAGNOSIS — Z3A30 30 weeks gestation of pregnancy: Secondary | ICD-10-CM | POA: Diagnosis not present

## 2023-07-07 DIAGNOSIS — O26899 Other specified pregnancy related conditions, unspecified trimester: Secondary | ICD-10-CM

## 2023-07-07 NOTE — Progress Notes (Signed)
 LOW-RISK PREGNANCY VISIT Patient name: Toni Moss MRN [id number]  Date of birth: 2000/06/23 Chief Complaint:   Routine Prenatal Visit (Rhogram)  History of Present Illness:   Toni Moss is a 23 y.o. G41P0000 female at [redacted]w[redacted]d with an Estimated Date of Delivery: 09/13/23 being seen today for ongoing management of a low-risk pregnancy.   Today she reports no complaints. Contractions: Not present.  .  Movement: Present. denies leaking of fluid.     06/16/2023    9:20 AM 03/30/2023    9:34 AM 03/02/2023    2:24 PM 12/20/2022    2:45 PM 12/20/2022    1:14 PM  Depression screen PHQ 2/9  Decreased Interest 1 1 2 1 3   Down, Depressed, Hopeless 1 1 2 1 3   PHQ - 2 Score 2 2 4 2 6   Altered sleeping 1 2 2 1 3   Tired, decreased energy 1 2 2 3 3   Change in appetite 0 2 2 2 3   Feeling bad or failure about yourself  0 0 2 0 3  Trouble concentrating 0 3 2 3 3   Moving slowly or fidgety/restless 0 3 2  3   Suicidal thoughts 0 0 0 0 0  PHQ-9 Score 4 14 16 11 24   Difficult doing work/chores  Somewhat difficult  Very difficult         06/16/2023    9:21 AM 03/30/2023    9:35 AM 03/02/2023    2:24 PM 12/20/2022    1:15 PM  GAD 7 : Generalized Anxiety Score  Nervous, Anxious, on Edge 1 2 2 3   Control/stop worrying 1 1 2 3   Worry too much - different things 1 1 2 3   Trouble relaxing 0 1 2 3   Restless 0 2 2 3   Easily annoyed or irritable 1 2 3 3   Afraid - awful might happen 0 1 3 3   Total GAD 7 Score 4 10 16 21   Anxiety Difficulty  Somewhat difficult  Extremely difficult      Review of Systems:   Pertinent items are noted in HPI Denies abnormal vaginal discharge w/ itching/odor/irritation, headaches, visual changes, shortness of breath, chest pain, abdominal pain, severe nausea/vomiting, or problems with urination or bowel movements unless otherwise stated above. Pertinent History Reviewed:  Reviewed past medical,surgical, social, obstetrical and family history.  Reviewed problem list,  medications and allergies. Physical Assessment:   Vitals:   07/07/23 1123  BP: 117/80  Pulse: (!) 121  Weight: 198 lb (89.8 kg)  Body mass index is 32.95 kg/m.        Physical Examination:   General appearance: Well appearing, and in no distress  Mental status: Alert, oriented to person, place, and time  Skin: Warm & dry  Cardiovascular: Normal heart rate noted  Respiratory: Normal respiratory effort, no distress  Abdomen: Soft, gravid, nontender  Pelvic: Cervical exam deferred         Extremities: Edema: None  Fetal Status: Fetal Heart Rate (bpm): 156 Fundal Height: 30 cm Movement: Present    Chaperone: N/A No results found for this or any previous visit (from the past 24 hours).  Assessment & Plan:  1) Low-risk pregnancy G1P0000 at [redacted]w[redacted]d with an Estimated Date of Delivery: 09/13/23   2) Tachycardia, decided not to take propranolol , got Zio in mail, is going to put on today, has appt w/ Dr. Emmette Harms 7/18   Meds: No orders of the defined types were placed in this encounter.  Labs/procedures today: Rhogam  Plan:  Continue routine obstetrical care  Next visit: prefers in person    Reviewed: Preterm labor symptoms and general obstetric precautions including but not limited to vaginal bleeding, contractions, leaking of fluid and fetal movement were reviewed in detail with the patient.  All questions were answered. Does have home bp cuff. Office bp cuff given: not applicable. Check bp weekly, let us  know if consistently >140 and/or >90.  Follow-up: Return in about 2 weeks (around 07/21/2023) for LROB.  Future Appointments  Date Time Provider Department Center  07/16/2023 10:00 AM Levester Reagin, NP [facility] None  08/05/2023  2:20 PM Tobb, Kardie, DO [facility] None    No orders of the defined types were placed in this encounter.  Ferd Householder CNM, [facility] 07/07/2023 11:40 AM

## 2023-07-07 NOTE — Patient Instructions (Signed)
 Toni Moss, thank you for choosing our office today! We appreciate the opportunity to meet your healthcare needs. You may receive a short survey by mail, e-mail, or through [employer]. If you are happy with your care we would appreciate if you could take just a few minutes to complete the survey questions. We read all of your comments and take your feedback very seriously. Thank you again for choosing our office.  Center for [employer] Team at [facility]  [facility] at [facility] ([address] [place], [place] [address]) Entrance C, located off of [address] Free 24/7 valet parking   CLASSES: Go to [email or web address] to register for classes (childbirth, breastfeeding, waterbirth, infant CPR, daddy bootcamp, etc.)  Call the office [phone]) or go to [facility] if: You begin to have strong, frequent contractions Your water  breaks.  Sometimes it is a big gush of fluid, sometimes it is just a trickle that keeps getting your panties wet or running down your legs You have vaginal bleeding.  It is normal to have a small amount of spotting if your cervix was checked.  You don't feel your baby moving like normal.  If you don't, get you something to eat and drink and lay down and focus on feeling your baby move.   If your baby is still not moving like normal, you should call the office or go to [facility].  Call the office [phone]) or go to [facility] for these signs of pre-eclampsia: Severe headache that does not go away with Tylenol  Visual changes- seeing spots, double, blurred vision Pain under your right breast or upper abdomen that does not go away with Tums or heartburn medicine Nausea and/or vomiting Severe swelling in your hands, feet, and face   Tdap Vaccine It is recommended that you get the Tdap vaccine during the third trimester of EACH pregnancy to help protect your baby from getting pertussis (whooping cough) 27-36 weeks is the BEST time to do  this so that you can pass the protection on to your baby. During pregnancy is better than after pregnancy, but if you are unable to get it during pregnancy it will be offered at the hospital.  You can get this vaccine with us , at the health department, your family doctor, or some local pharmacies Everyone who will be around your baby should also be up-to-date on their vaccines before the baby comes. Adults (who are not pregnant) only need 1 dose of Tdap during adulthood.   [facility] Pediatricians/Family Doctors [facility]): [address] Meg Spina, [phone]           [facility]: [address], [phone]                [facility]): [address], [phone] (call to ask if accepting patients) [facility] Department: [address], [place], [phone]    [facility] Pediatricians/Family Doctors [facility]): [phone] [address], [phone] [facility]: [address] [place], [phone] [facility]: [address], [phone]  [place] [facility]  [facility]): [phone] [facility]: [address] [phone]   [facility] Doctors [facility]: [address], [phone]  [facility] Family Doctors  [facility]: [phone], [phone]  Home Blood Pressure Monitoring for Patients   Your provider has recommended that you check your  blood pressure (BP) at least once a week at home. If you do not have a blood pressure cuff at home, one will be provided for you. Contact your provider if you have not received your monitor within 1 week.   Helpful Tips for Accurate Home Blood Pressure Checks  Don't smoke, exercise, or drink caffeine 30 minutes before checking your BP Use the restroom before checking your BP (a full bladder can raise your  pressure) Relax in a comfortable upright chair Feet on the ground Left arm resting comfortably on a flat surface at the level of your heart Legs uncrossed Back supported Sit quietly and don't talk Place the cuff on your bare arm Adjust snuggly, so that only two fingertips can fit between your skin and the top of the cuff Check 2 readings separated by at least one minute Keep a log of your BP readings For a visual, please reference this diagram: http://ccnc.care/bpdiagram  Provider Name: [facility]     Phone: [phone]  Zone 1: ALL CLEAR  Continue to monitor your symptoms:  BP reading is less than 140 (top number) or less than 90 (bottom number)  No right upper stomach pain No headaches or seeing spots No feeling nauseated or throwing up No swelling in face and hands  Zone 2: CAUTION Call your doctor's office for any of the following:  BP reading is greater than 140 (top number) or greater than 90 (bottom number)  Stomach pain under your ribs in the middle or right side Headaches or seeing spots Feeling nauseated or throwing up Swelling in face and hands  Zone 3: EMERGENCY  Seek immediate medical care if you have any of the following:  BP reading is greater than160 (top number) or greater than 110 (bottom number) Severe headaches not improving with Tylenol  Serious difficulty catching your breath Any worsening symptoms from Zone 2  Preterm Labor and Birth Information  The normal length of a pregnancy is 39-41 weeks. Preterm labor is when labor starts before 37 completed weeks of pregnancy. What are the risk factors for preterm labor? Preterm labor is more likely to occur in women who: Have certain infections during pregnancy such as a bladder infection, sexually transmitted infection, or infection inside the uterus (chorioamnionitis). Have a shorter-than-normal cervix. Have gone into preterm labor before. Have had surgery on their cervix. Are younger than age 39  or older than age 58. Are African American. Are pregnant with twins or multiple babies (multiple gestation). Take street drugs or smoke while pregnant. Do not gain enough weight while pregnant. Became pregnant shortly after having been pregnant. What are the symptoms of preterm labor? Symptoms of preterm labor include: Cramps similar to those that can happen during a menstrual period. The cramps may happen with diarrhea. Pain in the abdomen or lower back. Regular uterine contractions that may feel like tightening of the abdomen. A feeling of increased pressure in the pelvis. Increased watery or bloody mucus discharge from the vagina. Water  breaking (ruptured amniotic sac). Why is it important to recognize signs of preterm labor? It is important to recognize signs of preterm labor because babies who are born prematurely may not be fully developed. This can put them at an increased risk for: Long-term (chronic) heart and lung problems. Difficulty immediately after birth with regulating body systems, including blood sugar, body temperature, heart rate, and breathing rate. Bleeding in the brain. Cerebral palsy. Learning difficulties. Death. These risks are highest for babies who are born before 34 weeks  of pregnancy. How is preterm labor treated? Treatment depends on the length of your pregnancy, your condition, and the health of your baby. It may involve: Having a stitch (suture) placed in your cervix to prevent your cervix from opening too early (cerclage). Taking or being given medicines, such as: Hormone medicines. These may be given early in pregnancy to help support the pregnancy. Medicine to stop contractions. Medicines to help mature the baby's lungs. These may be prescribed if the risk of delivery is high. Medicines to prevent your baby from developing cerebral palsy. If the labor happens before 34 weeks of pregnancy, you may need to stay in the hospital. What should I do if I  think I am in preterm labor? If you think that you are going into preterm labor, call your health care provider right away. How can I prevent preterm labor in future pregnancies? To increase your chance of having a full-term pregnancy: Do not use any tobacco products, such as cigarettes, chewing tobacco, and e-cigarettes. If you need help quitting, ask your health care provider. Do not use street drugs or medicines that have not been prescribed to you during your pregnancy. Talk with your health care provider before taking any herbal supplements, even if you have been taking them regularly. Make sure you gain a healthy amount of weight during your pregnancy. Watch for infection. If you think that you might have an infection, get it checked right away. Make sure to tell your health care provider if you have gone into preterm labor before. This information is not intended to replace advice given to you by your health care provider. Make sure you discuss any questions you have with your health care provider. Document Revised: 04/28/2018 Document Reviewed: 05/28/2015 Elsevier Patient Education  2020 [employer].

## 2023-07-15 DIAGNOSIS — Z3482 Encounter for supervision of other normal pregnancy, second trimester: Secondary | ICD-10-CM | POA: Diagnosis not present

## 2023-07-15 DIAGNOSIS — Z3483 Encounter for supervision of other normal pregnancy, third trimester: Secondary | ICD-10-CM | POA: Diagnosis not present

## 2023-07-16 ENCOUNTER — Encounter (HOSPITAL_COMMUNITY): Payer: Self-pay | Admitting: Family

## 2023-07-16 ENCOUNTER — Telehealth (HOSPITAL_COMMUNITY): Admitting: Psychiatry

## 2023-07-16 ENCOUNTER — Ambulatory Visit (HOSPITAL_BASED_OUTPATIENT_CLINIC_OR_DEPARTMENT_OTHER): Admitting: Family

## 2023-07-16 DIAGNOSIS — F603 Borderline personality disorder: Secondary | ICD-10-CM

## 2023-07-16 DIAGNOSIS — F3341 Major depressive disorder, recurrent, in partial remission: Secondary | ICD-10-CM

## 2023-07-16 MED ORDER — LAMOTRIGINE 25 MG PO TABS: 25.0000 mg | ORAL_TABLET | Freq: Every evening | ORAL | 1 refills | Status: DC
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
## 2023-07-25 ENCOUNTER — Ambulatory Visit: Admitting: Advanced Practice Midwife

## 2023-07-25 VITALS — BP 116/81 | HR 123 | Wt 200.5 lb

## 2023-07-25 DIAGNOSIS — Z3A32 32 weeks gestation of pregnancy: Secondary | ICD-10-CM | POA: Diagnosis not present

## 2023-07-25 DIAGNOSIS — O99413 Diseases of the circulatory system complicating pregnancy, third trimester: Secondary | ICD-10-CM

## 2023-07-25 DIAGNOSIS — R000 Tachycardia, unspecified: Secondary | ICD-10-CM | POA: Diagnosis not present

## 2023-07-25 DIAGNOSIS — Z3403 Encounter for supervision of normal first pregnancy, third trimester: Secondary | ICD-10-CM

## 2023-07-25 NOTE — Progress Notes (Signed)
   LOW-RISK PREGNANCY VISIT Patient name: Toni Moss MRN [id number]  Date of birth: Oct 23, 2000 Chief Complaint:   Routine Prenatal Visit  History of Present Illness:   Toni Moss is a 23 y.o. G46P0000 female at [redacted]w[redacted]d with an Estimated Date of Delivery: 09/13/23 being seen today for ongoing management of a low-risk pregnancy.  Today she reports restless legs, trouble sleeping. Contractions: Not present. Vag. Bleeding: None.  Movement: Present. denies leaking of fluid. Review of Systems:   Pertinent items are noted in HPI Denies abnormal vaginal discharge w/ itching/odor/irritation, headaches, visual changes, shortness of breath, chest pain, abdominal pain, severe nausea/vomiting, or problems with urination or bowel movements unless otherwise stated above. Pertinent History Reviewed:  Reviewed past medical,surgical, social, obstetrical and family history.  Reviewed problem list, medications and allergies. Physical Assessment:   Vitals:   07/25/23 1049  BP: 116/81  Pulse: (!) 123  Weight: 200 lb 8 oz (90.9 kg)  Body mass index is 33.36 kg/m.        Physical Examination:   General appearance: Well appearing, and in no distress  Mental status: Alert, oriented to person, place, and time  Skin: Warm & dry  Cardiovascular: Normal heart rate noted  Respiratory: Normal respiratory effort, no distress  Abdomen: Soft, gravid, nontender  Pelvic: Cervical exam deferred         Extremities:   Chaperone:  N/A   Fetal Status: Fetal Heart Rate (bpm): 154 Fundal Height: 33 cm Movement: Present      No results found for this or any previous visit (from the past 24 hours).  Assessment & Plan:    Pregnancy: G1P0000 at [redacted]w[redacted]d 1. Encounter for supervision of normal first pregnancy in third trimester (Primary)   2. Sinus tachycardia Still not taking propanalol d/t possible SGA,doing OK. Plans to see Dr. Sheena as scheduled     Meds: No orders of the defined types were placed in this  encounter.  Labs/procedures today: none  Plan:  Continue routine obstetrical care  Next visit: prefers in person    Reviewed: Preterm labor symptoms and general obstetric precautions including but not limited to vaginal bleeding, contractions, leaking of fluid and fetal movement were reviewed in detail with the patient.  All questions were answered. Has home bp cuff. . Check bp weekly, let us  know if >140/90.   Follow-up: Return in about 2 weeks (around 08/08/2023) for LROB.  Future Appointments  Date Time Provider Department Center  08/05/2023  2:20 PM Tobb, Kardie, DO [facility] None  08/08/2023 10:10 AM Delores Nidia CROME, FNP [facility]    No orders of the defined types were placed in this encounter.  Cathlean Ely DNP, CNM 07/25/2023 11:21 AM\

## 2023-07-28 ENCOUNTER — Other Ambulatory Visit (HOSPITAL_BASED_OUTPATIENT_CLINIC_OR_DEPARTMENT_OTHER): Payer: Self-pay

## 2023-07-29 ENCOUNTER — Ambulatory Visit
Admission: RE | Admit: 2023-07-29 | Discharge: 2023-07-29 | Disposition: A | Discharge status: Home / Self Care | Attending: Family Medicine

## 2023-07-29 VITALS — BP 101/72 | HR 125 | Temp 98.5°F | Resp 18 | Ht 65.0 in | Wt 198.0 lb

## 2023-07-29 DIAGNOSIS — L989 Disorder of the skin and subcutaneous tissue, unspecified: Secondary | ICD-10-CM | POA: Diagnosis not present

## 2023-07-29 MED ORDER — CHLORHEXIDINE GLUCONATE 4 % EX SOLN: Freq: Every day | CUTANEOUS | 0 refills | Status: DC | PRN

## 2023-07-29 NOTE — ED Triage Notes (Signed)
 Pt states that she has left leg pain. Pt states that she had a mole removed on her leg that isn't healing and is now causing leg pain. X1 week
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
## 2023-07-29 NOTE — Discharge Instructions (Signed)
 Clean the area at least once a day with the Hibiclens  solution and apply antibiotic ointment and a nonstick dressing.  Do this until the area is fully healed.

## 2023-07-30 DIAGNOSIS — Z419 Encounter for procedure for purposes other than remedying health state, unspecified: Secondary | ICD-10-CM | POA: Diagnosis not present

## 2023-08-05 ENCOUNTER — Ambulatory Visit (INDEPENDENT_AMBULATORY_CARE_PROVIDER_SITE_OTHER): Admitting: Cardiology

## 2023-08-05 ENCOUNTER — Encounter: Payer: Self-pay | Admitting: Cardiology

## 2023-08-05 VITALS — BP 114/86 | HR 142 | Ht 65.0 in | Wt 201.3 lb

## 2023-08-05 DIAGNOSIS — Z3A34 34 weeks gestation of pregnancy: Secondary | ICD-10-CM

## 2023-08-05 DIAGNOSIS — R002 Palpitations: Secondary | ICD-10-CM

## 2023-08-05 NOTE — Progress Notes (Signed)
 [facility]  New Evaluation  Date:  08/07/2023   ID:  Vee, Bahe 07/25/2000, MRN [id number]  PCP:  Alphonsa Glendia LABOR, MD   [facility] Providers Cardiologist:  None  Electrophysiologist:  None       Referring MD: Loreli Suzen BIRCH, CNM   Chief Complaint:  I am   History of Present Illness:    Toni Moss is a 23 y.o. female [G1P0000] who is being seen today for the evaluation of heart rate issues during pregnancy at the request of Loreli Suzen BIRCH, CNM.   Medical hx includes ADHD, Anxiety, Depression, IBS and PTSD.  She reports that she has been experiencing an elevated heart rate for the past three to four years even before pregnancy. She tells me that the heart rate goes between 90 to 100 beats per minute at that time but now increasing to 144 beats per minute daily.  She tells me of an episode where her heart rate raced abruptly up to heart rate of 177 beats per minute led to hospitalization.   She is currently [redacted] weeks pregnant and has been prescribed propranolol  10 mg every eight hours but has not started it due to concerns about fetal growth. She denies chest pain, shortness of breath, or dizziness with her elevated heart rate, except during the severe episode.   Her family history includes heart disease in her maternal grandfather and paternal grandmother, and her maternal uncle died from heart failure. She brought a heart monitor to the appointment for assistance with its application.   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   Chlamydia 02/25/2020   Treated 02/24/20 at Urgent Care with doxy, POC___________   Depression    IBS (irritable bowel syndrome)    Panic disorder with agoraphobia    Personality disorder (HCC)     PTSD (post-traumatic stress disorder)    Tic disorder     Past Surgical History:  Procedure Laterality Date   COLONOSCOPY     TYMPANOSTOMY TUBE PLACEMENT     Wisdom Teeth Removal        OB History     Gravida  1   Para  0   Term  0   Preterm  0   AB  0   Living  0      SAB  0   IAB  0   Ectopic  0   Multiple  0   Live Births  0               Current Medications: Current Meds  Medication Sig   chlorhexidine  (HIBICLENS ) 4 % external liquid Apply topically daily as needed.   lamoTRIgine  (LAMICTAL ) 25 MG tablet Take 1 tablet (25 mg total) by mouth at bedtime.   omeprazole  (PRILOSEC) 20 MG capsule Take 1 capsule (20 mg total) by mouth daily.   Potassium 99 MG TABS Take 99 mg by mouth daily.   Prenatal Vit-Fe Fumarate-FA (PRENATAL VITAMIN PO) Take 1 tablet by mouth daily.     Allergies:   Prozac  [fluoxetine  hcl], Doxycycline , Penicillins, and Phenergan  [promethazine ]   Social History   Socioeconomic History   Marital status: Significant Other    Spouse name: Not on file   Number of children: Not on  file   Years of education: Not on file   Highest education level: 12th grade  Occupational History   Not on file  Tobacco Use   Smoking status: Never   Smokeless tobacco: Never  Vaping Use   Vaping status: Never Used  Substance and Sexual Activity   Alcohol use: No   Drug use: No   Sexual activity: Yes    Birth control/protection: None  Other Topics Concern   Not on file  Social History Narrative   Haven attends 6 th grade at [employer]. She is doing average this school year. She enjoys school.   Haven's parents are divorced. She has little contact with her father. She has adult-aged siblings that do not live in the home.    Social Drivers of Corporate investment banker Strain: Low Risk  (03/30/2023)   Overall Financial Resource Strain (CARDIA)    Difficulty of Paying Living Expenses: Not hard at all  Recent Concern: Financial  Resource Strain - Medium Risk (03/02/2023)   Overall Financial Resource Strain (CARDIA)    Difficulty of Paying Living Expenses: Somewhat hard  Food Insecurity: No Food Insecurity (03/30/2023)   Hunger Vital Sign    Worried About Running Out of Food in the Last Year: Never true    Ran Out of Food in the Last Year: Never true  Transportation Needs: No Transportation Needs (03/30/2023)   PRAPARE - Administrator, Civil Service (Medical): No    Lack of Transportation (Non-Medical): No  Physical Activity: Insufficiently Active (03/30/2023)   Exercise Vital Sign    Days of Exercise per Week: 2 days    Minutes of Exercise per Session: 20 min  Stress: Stress Concern Present (03/30/2023)   [employer] of Occupational Health - Occupational Stress Questionnaire    Feeling of Stress : Rather much  Social Connections: Unknown (03/30/2023)   Social Connection and Isolation Panel    Frequency of Communication with Friends and Family: More than three times a week    Frequency of Social Gatherings with Friends and Family: Once a week    Attends Religious Services: Never    Database administrator or Organizations: No    Attends Engineer, structural: Never    Marital Status: Patient declined  Recent Concern: Social Connections - Socially Isolated (03/02/2023)   Social Connection and Isolation Panel    Frequency of Communication with Friends and Family: Twice a week    Frequency of Social Gatherings with Friends and Family: Once a week    Attends Religious Services: Never    Database administrator or Organizations: No    Attends Engineer, structural: Never    Marital Status: Never married      Family History  Problem Relation Age of Onset   Depression Mother    Colon polyps Mother    Tourette syndrome Sister    Colon polyps Maternal Aunt    Alcohol abuse Maternal Uncle    Depression Maternal Grandmother    Colon polyps Maternal Grandmother    Lung cancer  Paternal Grandmother    Tics Cousin    Bipolar disorder Other    Colon cancer Neg Hx    Esophageal cancer Neg Hx    Rectal cancer Neg Hx    Stomach cancer Neg Hx       ROS:   Please see the history of present illness.     All other systems reviewed and are negative.  Labs/EKG Reviewed:    EKG:  None today   Recent Labs: 03/04/2023: Magnesium 1.4 04/06/2023: ALT 14; BUN 8; Creatinine, Ser 0.48; Potassium 3.6; Sodium 137; TSH 1.016 06/16/2023: Hemoglobin 11.6; Platelets 351   Recent Lipid Panel Lab Results  Component Value Date/Time   CHOL 148 11/04/2022 10:03 AM   TRIG 57 11/04/2022 10:03 AM   HDL 52 11/04/2022 10:03 AM   CHOLHDL 2.8 11/04/2022 10:03 AM   LDLCALC 84 11/04/2022 10:03 AM    Physical Exam:    VS:  BP 114/86 (BP Location: Right Arm, Patient Position: Sitting, Cuff Size: Large)   Pulse (!) 142   Ht 5' 5 (1.651 m)   Wt 201 lb 4.8 oz (91.3 kg)   LMP 12/07/2022 (Exact Date)   SpO2 98%   BMI 33.50 kg/m     Wt Readings from Last 3 Encounters:  08/05/23 201 lb 4.8 oz (91.3 kg)  07/29/23 198 lb (89.8 kg)  07/25/23 200 lb 8 oz (90.9 kg)     GEN:  Well nourished, well developed in no acute distress HEENT: Normal NECK: No JVD; No carotid bruits LYMPHATICS: No lymphadenopathy CARDIAC: RRR, no murmurs, rubs, gallops RESPIRATORY:  Clear to auscultation without rales, wheezing or rhonchi  ABDOMEN: Soft, non-tender, non-distended MUSCULOSKELETAL:  No edema; No deformity  SKIN: Warm and dry NEUROLOGIC:  Alert and oriented x 3 PSYCHIATRIC:  Normal affect    Risk Assessment/Risk Calculators:     CARPREG II Risk Prediction Index Score:  1.  The patient's risk for a primary cardiac event is 5%.   Modified World Health Organization [facility]) Classification of Maternal CV Risk   Class I         ASSESSMENT & PLAN:    Tachycardia This is not new to pregnancy with heart rates up to 177 bpm, normal EKGs, no symptoms except during past sepsis. Currently  pregnant, propranolol  discussed for heart rate management with dose-dependent fetal growth risks. Low-dose propranolol  considered safe. - Initiate propranolol  10 mg at bedtime. - Wear heart monitor for two weeks. - Follow-up in eight weeks postpartum.   [redacted] weeks pregnant. Discussed propranolol 's safety at low doses and potential need for fetal growth monitoring. -  - Schedule lipid profile six months postpartum.    Patient Instructions  Medication Instructions:  Your physician recommends that you continue on your current medications as directed. Please refer to the Current Medication list given to you today.  *If you need a refill on your cardiac medications before your next appointment, please call your pharmacy*   Follow-Up: At [facility], you and your health needs are our priority.  As part of our continuing mission to provide you with exceptional heart care, our providers are all part of one team.  This team includes your primary Cardiologist (physician) and Advanced Practice Providers or APPs (Physician Assistants and Nurse Practitioners) who all work together to provide you with the care you need, when you need it.  Your next appointment:   8 week(s) Mag   Provider:   Iori Gigante, DO      Dispo:  No follow-ups on file.   Medication Adjustments/Labs and Tests Ordered: Current medicines are reviewed at length with the patient today.  Concerns regarding medicines are outlined above.  Tests Ordered: No orders of the defined types were placed in this encounter.  Medication Changes: No orders of the defined types were placed in this encounter.

## 2023-08-05 NOTE — Patient Instructions (Signed)
 Medication Instructions:  Your physician recommends that you continue on your current medications as directed. Please refer to the Current Medication list given to you today.  *If you need a refill on your cardiac medications before your next appointment, please call your pharmacy*   Follow-Up: At [facility], you and your health needs are our priority.  As part of our continuing mission to provide you with exceptional heart care, our providers are all part of one team.  This team includes your primary Cardiologist (physician) and Advanced Practice Providers or APPs (Physician Assistants and Nurse Practitioners) who all work together to provide you with the care you need, when you need it.  Your next appointment:   8 week(s) Mag   Provider:   Kardie Tobb, DO

## 2023-08-08 ENCOUNTER — Ambulatory Visit: Admitting: Obstetrics and Gynecology

## 2023-08-08 VITALS — BP 103/72 | HR 112 | Wt 201.8 lb

## 2023-08-08 DIAGNOSIS — Z3403 Encounter for supervision of normal first pregnancy, third trimester: Secondary | ICD-10-CM

## 2023-08-08 DIAGNOSIS — R000 Tachycardia, unspecified: Secondary | ICD-10-CM | POA: Diagnosis not present

## 2023-08-08 DIAGNOSIS — O26893 Other specified pregnancy related conditions, third trimester: Secondary | ICD-10-CM

## 2023-08-08 DIAGNOSIS — Z6791 Unspecified blood type, Rh negative: Secondary | ICD-10-CM

## 2023-08-08 DIAGNOSIS — Z3A34 34 weeks gestation of pregnancy: Secondary | ICD-10-CM | POA: Diagnosis not present
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
## 2023-08-22 ENCOUNTER — Other Ambulatory Visit (HOSPITAL_COMMUNITY)
Admission: RE | Admit: 2023-08-22 | Discharge: 2023-08-22 | Disposition: A | Discharge status: Home / Self Care | Source: Ambulatory Visit | Attending: Advanced Practice Midwife | Admitting: Advanced Practice Midwife

## 2023-08-22 ENCOUNTER — Ambulatory Visit (INDEPENDENT_AMBULATORY_CARE_PROVIDER_SITE_OTHER): Admitting: Advanced Practice Midwife

## 2023-08-22 VITALS — BP 113/74 | HR 114 | Wt 204.0 lb

## 2023-08-22 DIAGNOSIS — Z3A36 36 weeks gestation of pregnancy: Secondary | ICD-10-CM

## 2023-08-22 DIAGNOSIS — R000 Tachycardia, unspecified: Secondary | ICD-10-CM | POA: Diagnosis not present

## 2023-08-22 DIAGNOSIS — Z3403 Encounter for supervision of normal first pregnancy, third trimester: Secondary | ICD-10-CM

## 2023-08-22 NOTE — Patient Instructions (Addendum)
 Things to Try After 37 weeks for Cervical Ripening (to get your cervix ready for labor) : May try one or all:     Try the [employer] at [email or web address].[email or web address] daily to improve baby's position and encourage the onset of labor.  Cervical Ripening: May try one or both Red Raspberry Leaf capsules or tea:  two 300mg  or 400mg  tablets with each meal, 2-3 times a day, or 1-3 cups of tea daily  Potential Side Effects Of Raspberry Leaf:  Most women do not experience any side effects from drinking raspberry leaf tea. However, nausea and loose stools are possible   Evening Primrose Oil capsules: take 1 capsule by mouth and place one capsule in the vagina every night.    Some of the potential side effects:  Upset stomach  Loose stools or diarrhea  Headaches  Nausea    3.  6 Dates a day (may taste better if warmed in microwave until soft). Found where raisins are in the grocery store    AM I IN LABOR? What is labor? Labor is the work that your body does to birth your baby. Your uterus (the womb) contracts. Your cervix (the mouth of the uterus) opens. You will push your baby out into the world.  What do contractions (labor pains) feel like? When they first start, contractions usually feel like cramps during your period. Sometimes you feel pain in your back. Most often, contractions feel like muscles pulling painfully in your lower belly. At first, the contractions will probably be 15 to 20 minutes apart. They will not feel too painful. As labor goes on, the contractions get stronger, closer together, and more painful.  How do I time the contractions? Time your contractions by counting the number of minutes from the start of one contraction to the start of the next contraction.  What should I do when the contractions start? If it is night and you can sleep, sleep. If it happens during the day, here are some things you can do to take care of yourself at home: ? Walk. If the pains you are  having are real labor, walking will make the contractions come faster and harder. If the contractions are not going to continue and be real labor, walking will make the contractions slow down. ? Take a shower or bath. This will help you relax. ? Eat. Labor is a big event. It takes a lot of energy. ? Drink water . Not drinking enough water  can cause false labor (contractions that hurt but do not open your cervix). If this is true labor, drinking water  will help you have strength to get through your labor. ? Take a nap. Get all the rest you can. ? Get a massage. If your labor is in your back, a strong massage on your lower back may feel very good. Getting a foot massage is always good. ? Don't panic. You can do this. Your body was made for this. You are strong!  When should I go to the hospital or call my health care provider? ? Your contractions have been 5 minutes apart or less for at least 1 hour. ? If several contractions are so painful you cannot walk or talk during one. ? Your bag of waters breaks. (You may have a big gush of water  or just water  that runs down your legs when you walk.)  Are there other reasons to call my health care provider? Yes, you should call your health care provider or go to the  hospital if you start to bleed like you are having a period-- blood that soaks your underwear or runs down your legs, if you have sudden severe pain, if your baby has not moved for several hours, or if you are leaking green fluid. The rule is as follows: If you are very concerned about something, call.

## 2023-08-22 NOTE — Progress Notes (Signed)
   LOW-RISK PREGNANCY VISIT Patient name: Toni Moss MRN [id number]  Date of birth: [birth year] Chief Complaint:   Routine Prenatal Visit  History of Present Illness:   Toni Moss is a 23 y.o. G39P0000 female at [redacted]w[redacted]d with an Estimated Date of Delivery: 09/13/23 being seen today for ongoing management of a low-risk pregnancy.  Today she reports no complaints. Contractions: Not present. Vag. Bleeding: None.  Movement: Present. denies leaking of fluid. Review of Systems:   Pertinent items are noted in HPI Denies abnormal vaginal discharge w/ itching/odor/irritation, headaches, visual changes, shortness of breath, chest pain, abdominal pain, severe nausea/vomiting, or problems with urination or bowel movements unless otherwise stated above. Pertinent History Reviewed:  Reviewed past medical,surgical, social, obstetrical and family history.  Reviewed problem list, medications and allergies. Physical Assessment:   Vitals:   08/22/23 1357  BP: 113/74  Pulse: (!) 114  Weight: 204 lb (92.5 kg)  Body mass index is 33.95 kg/m.        Physical Examination:   General appearance: Well appearing, and in no distress  Mental status: Alert, oriented to person, place, and time  Skin: Warm & dry  Cardiovascular: Normal heart rate noted  Respiratory: Normal respiratory effort, no distress  Abdomen: Soft, gravid, nontender  Pelvic: Cervical exam performed  Dilation: Closed Effacement (%): Thick    Extremities: Edema: None Chaperone:  Vernell Ruddle, RN   Fetal Status: Fetal Heart Rate (bpm): 135 Fundal Height: 35 cm Movement: Present Presentation: Vertex (confirmed by US )    No results found for this or any previous visit (from the past 24 hours).  Assessment & Plan:    Pregnancy: G1P0000 at [redacted]w[redacted]d 1. [redacted] weeks gestation of pregnancy (Primary)  - Cervicovaginal ancillary only - Culture, beta strep (group b only)  2. Encounter for supervision of normal first pregnancy in third  trimester   3. Sinus tachycardia Not taking propanolol     Meds: No orders of the defined types were placed in this encounter.  Labs/procedures today: GBS, GC, CHL  Plan:  Continue routine obstetrical care  Next visit: prefers in person    Reviewed: Term labor symptoms and general obstetric precautions including but not limited to vaginal bleeding, contractions, leaking of fluid and fetal movement were reviewed in detail with the patient.  All questions were answered. Has home bp cuff.. Check bp weekly, let us  know if >140/90.   Follow-up: Return for As scheduled.  Future Appointments  Date Time Provider Department Center  08/29/2023  1:50 PM Marilynn Nest, DO [facility]  09/05/2023 11:10 AM Kizzie Suzen SAUNDERS, CNM [facility]  09/12/2023  1:50 PM Kizzie Suzen SAUNDERS, CNM [facility]  09/28/2023 10:20 AM Tobb, Dub, DO [facility]    Orders Placed This Encounter  Procedures   Culture, beta strep (group b only)   Cathlean Ely DNP, CNM 08/22/2023 2:29 PM

## 2023-08-24 LAB — CERVICOVAGINAL ANCILLARY ONLY
Chlamydia: NEGATIVE
Comment: NEGATIVE
Comment: NORMAL
Neisseria Gonorrhea: NEGATIVE

## 2023-08-25 LAB — CULTURE, BETA STREP (GROUP B ONLY): Strep Gp B Culture: POSITIVE — AB

## 2023-08-29 ENCOUNTER — Encounter: Payer: Self-pay | Admitting: Obstetrics & Gynecology

## 2023-08-29 ENCOUNTER — Ambulatory Visit: Admitting: Obstetrics & Gynecology

## 2023-08-29 VITALS — BP 109/76 | HR 89 | Wt 201.2 lb

## 2023-08-29 DIAGNOSIS — Z3403 Encounter for supervision of normal first pregnancy, third trimester: Secondary | ICD-10-CM | POA: Diagnosis not present

## 2023-08-29 DIAGNOSIS — Z3A37 37 weeks gestation of pregnancy: Secondary | ICD-10-CM | POA: Diagnosis not present

## 2023-08-29 NOTE — Progress Notes (Signed)
   LOW-RISK PREGNANCY VISIT Patient name: Toni Moss MRN [id number]  Date of birth: [birth year] Chief Complaint:   Routine Prenatal Visit  History of Present Illness:   Toni Moss is a 23 y.o. G18P0000 female at [redacted]w[redacted]d with an Estimated Date of Delivery: 09/13/23 being seen today for ongoing management of a low-risk pregnancy.   -Rh negative -Anxiety/Dep- doing well with Lamictal  -GBS positive     06/16/2023    9:20 AM 03/30/2023    9:34 AM 03/02/2023    2:24 PM 12/20/2022    2:45 PM 12/20/2022    1:14 PM  Depression screen PHQ 2/9  Decreased Interest 1 1 2 1 3   Down, Depressed, Hopeless 1 1 2 1 3   PHQ - 2 Score 2 2 4 2 6   Altered sleeping 1 2 2 1 3   Tired, decreased energy 1 2 2 3 3   Change in appetite 0 2 2 2 3   Feeling bad or failure about yourself  0 0 2 0 3  Trouble concentrating 0 3 2 3 3   Moving slowly or fidgety/restless 0 3 2  3   Suicidal thoughts 0 0 0 0 0  PHQ-9 Score 4 14 16 11 24   Difficult doing work/chores  Somewhat difficult  Very difficult     Today she reports no complaints. Contractions: Not present. Vag. Bleeding: None.  Movement: Present. denies leaking of fluid. Review of Systems:   Pertinent items are noted in HPI Denies abnormal vaginal discharge w/ itching/odor/irritation, headaches, visual changes, shortness of breath, chest pain, abdominal pain, severe nausea/vomiting, or problems with urination or bowel movements unless otherwise stated above. Pertinent History Reviewed:  Reviewed past medical,surgical, social, obstetrical and family history.  Reviewed problem list, medications and allergies.  Physical Assessment:   Vitals:   08/29/23 1351 08/29/23 1407  BP: (!) 144/84 109/76  Pulse: 71 89  Weight: 201 lb 3.2 oz (91.3 kg)   Body mass index is 33.48 kg/m.        Physical Examination:   General appearance: Well appearing, and in no distress  Mental status: Alert, oriented to person, place, and time  Skin: Warm & dry  Respiratory: Normal  respiratory effort, no distress  Abdomen: Soft, gravid, nontender  Pelvic: Cervical exam deferred  Dilation: Closed Effacement (%): Thick Station: -3  Extremities:    Psych:  mood and affect appropriate  Fetal Status: Fetal Heart Rate (bpm): 145 Fundal Height: 36 cm Movement: Present    Chaperone: pt declined    No results found for this or any previous visit (from the past 24 hours).   Assessment & Plan:  1) Low-risk pregnancy G1P0000 at [redacted]w[redacted]d with an Estimated Date of Delivery: 09/13/23   2) Rh neg, s/p RhoGAM 3) GBS positive- PCN in labor  -repeat BP within normal range, pt asymptomatic   Meds: No orders of the defined types were placed in this encounter.  Labs/procedures today: none  Plan:  Continue routine obstetrical care  Next visit: prefers in person    Reviewed: Term labor symptoms and general obstetric precautions including but not limited to vaginal bleeding, contractions, leaking of fluid and fetal movement were reviewed in detail with the patient.  All questions were answered.   Follow-up: Return in about 1 week (around 09/05/2023) for LROB visit.  No orders of the defined types were placed in this encounter.   Aveyah Greenwood, DO Attending Obstetrician & Gynecologist, [facility] for [employer], [facility]

## 2023-08-30 DIAGNOSIS — Z419 Encounter for procedure for purposes other than remedying health state, unspecified: Secondary | ICD-10-CM | POA: Diagnosis not present

## 2023-08-31 ENCOUNTER — Inpatient Hospital Stay (HOSPITAL_COMMUNITY)
Admission: AD | Admit: 2023-08-31 | Discharge: 2023-09-04 | DRG: 807 | Disposition: A | Discharge status: Home / Self Care | Attending: Obstetrics & Gynecology | Admitting: Obstetrics & Gynecology

## 2023-08-31 ENCOUNTER — Other Ambulatory Visit: Payer: Self-pay

## 2023-08-31 ENCOUNTER — Encounter (HOSPITAL_COMMUNITY): Payer: Self-pay | Admitting: Obstetrics & Gynecology

## 2023-08-31 DIAGNOSIS — O99824 Streptococcus B carrier state complicating childbirth: Secondary | ICD-10-CM | POA: Diagnosis not present

## 2023-08-31 DIAGNOSIS — Z5986 Financial insecurity: Secondary | ICD-10-CM | POA: Diagnosis not present

## 2023-08-31 DIAGNOSIS — Z30017 Encounter for initial prescription of implantable subdermal contraceptive: Secondary | ICD-10-CM

## 2023-08-31 DIAGNOSIS — Z3A38 38 weeks gestation of pregnancy: Secondary | ICD-10-CM | POA: Diagnosis not present

## 2023-08-31 DIAGNOSIS — F411 Generalized anxiety disorder: Secondary | ICD-10-CM | POA: Diagnosis present

## 2023-08-31 DIAGNOSIS — K8020 Calculus of gallbladder without cholecystitis without obstruction: Secondary | ICD-10-CM

## 2023-08-31 DIAGNOSIS — O4292 Full-term premature rupture of membranes, unspecified as to length of time between rupture and onset of labor: Secondary | ICD-10-CM | POA: Diagnosis not present

## 2023-08-31 DIAGNOSIS — Z6791 Unspecified blood type, Rh negative: Secondary | ICD-10-CM | POA: Diagnosis not present

## 2023-08-31 DIAGNOSIS — O4290 Premature rupture of membranes, unspecified as to length of time between rupture and onset of labor, unspecified weeks of gestation: Secondary | ICD-10-CM | POA: Diagnosis present

## 2023-08-31 DIAGNOSIS — O99214 Obesity complicating childbirth: Secondary | ICD-10-CM | POA: Diagnosis not present

## 2023-08-31 DIAGNOSIS — Z880 Allergy status to penicillin: Secondary | ICD-10-CM

## 2023-08-31 DIAGNOSIS — F3341 Major depressive disorder, recurrent, in partial remission: Secondary | ICD-10-CM

## 2023-08-31 DIAGNOSIS — Z6981 Encounter for mental health services for victim of other abuse: Secondary | ICD-10-CM

## 2023-08-31 DIAGNOSIS — O99820 Streptococcus B carrier state complicating pregnancy: Secondary | ICD-10-CM | POA: Diagnosis not present

## 2023-08-31 DIAGNOSIS — Z3403 Encounter for supervision of normal first pregnancy, third trimester: Secondary | ICD-10-CM

## 2023-08-31 DIAGNOSIS — O99344 Other mental disorders complicating childbirth: Secondary | ICD-10-CM | POA: Diagnosis not present

## 2023-08-31 DIAGNOSIS — Z79899 Other long term (current) drug therapy: Secondary | ICD-10-CM

## 2023-08-31 DIAGNOSIS — Z3400 Encounter for supervision of normal first pregnancy, unspecified trimester: Secondary | ICD-10-CM

## 2023-08-31 DIAGNOSIS — O26893 Other specified pregnancy related conditions, third trimester: Secondary | ICD-10-CM | POA: Diagnosis not present

## 2023-08-31 DIAGNOSIS — O26899 Other specified pregnancy related conditions, unspecified trimester: Secondary | ICD-10-CM

## 2023-08-31 DIAGNOSIS — Z2839 Other underimmunization status: Secondary | ICD-10-CM

## 2023-08-31 DIAGNOSIS — R000 Tachycardia, unspecified: Secondary | ICD-10-CM | POA: Diagnosis present

## 2023-08-31 LAB — RUPTURE OF MEMBRANE (ROM)PLUS: Rom Plus: POSITIVE

## 2023-08-31 LAB — CBC
HCT: 35.4 % — ABNORMAL LOW (ref 36.0–46.0)
Hemoglobin: 11.6 g/dL — ABNORMAL LOW (ref 12.0–15.0)
MCH: 28.6 pg (ref 26.0–34.0)
MCHC: 32.8 g/dL (ref 30.0–36.0)
MCV: 87.4 fL (ref 80.0–100.0)
Platelets: 316 K/uL (ref 150–400)
RBC: 4.05 MIL/uL (ref 3.87–5.11)
RDW: 14.7 % (ref 11.5–15.5)
WBC: 20 K/uL — ABNORMAL HIGH (ref 4.0–10.5)
nRBC: 0 % (ref 0.0–0.2)

## 2023-08-31 LAB — WET PREP, GENITAL
Clue Cells Wet Prep HPF POC: NONE SEEN
Sperm: NONE SEEN
Trich, Wet Prep: NONE SEEN
WBC, Wet Prep HPF POC: >=10 — AB (ref <10)
Yeast Wet Prep HPF POC: NONE SEEN

## 2023-08-31 LAB — TYPE AND SCREEN
ABO/RH(D): A NEG
Antibody Screen: POSITIVE

## 2023-08-31 LAB — POCT FERN TEST: POCT Fern Test: NEGATIVE

## 2023-08-31 MED ORDER — OXYTOCIN-SODIUM CHLORIDE 30-0.9 UT/500ML-% IV SOLN: 2.5000 [IU]/h | INTRAVENOUS | Status: DC

## 2023-08-31 MED ORDER — DIPHENHYDRAMINE HCL 25 MG PO CAPS
50.0000 mg | ORAL_CAPSULE | Freq: Four times a day (QID) | ORAL | Status: DC | PRN
  Administered 2023-08-31 (×2): 50 mg via ORAL
  Filled 2023-08-31: qty 2

## 2023-08-31 MED ORDER — LACTATED RINGERS IV SOLN: INTRAVENOUS | Status: DC

## 2023-08-31 MED ORDER — OXYCODONE-ACETAMINOPHEN 5-325 MG PO TABS: 2.0000 | ORAL_TABLET | ORAL | Status: DC | PRN

## 2023-08-31 MED ORDER — CEFAZOLIN SODIUM-DEXTROSE 1-4 GM/50ML-% IV SOLN
1.0000 g | Freq: Three times a day (TID) | INTRAVENOUS | Status: DC
  Administered 2023-09-01 (×3): 1 g via INTRAVENOUS
  Filled 2023-08-31 (×3): qty 50

## 2023-08-31 MED ORDER — LACTATED RINGERS IV SOLN: 500.0000 mL | INTRAVENOUS | Status: DC | PRN

## 2023-08-31 MED ORDER — MISOPROSTOL 50MCG HALF TABLET
50.0000 ug | ORAL_TABLET | ORAL | Status: DC | PRN
  Administered 2023-08-31 (×2): 50 ug via BUCCAL
  Filled 2023-08-31: qty 1

## 2023-08-31 MED ORDER — FENTANYL CITRATE (PF) 100 MCG/2ML IJ SOLN
50.0000 ug | INTRAMUSCULAR | Status: DC | PRN
  Administered 2023-08-31 (×4): 100 ug via INTRAVENOUS
  Filled 2023-08-31 (×2): qty 2

## 2023-08-31 MED ORDER — HYDROXYZINE HCL 50 MG PO TABS: 50.0000 mg | ORAL_TABLET | Freq: Four times a day (QID) | ORAL | Status: DC | PRN

## 2023-08-31 MED ORDER — SOD CITRATE-CITRIC ACID 500-334 MG/5ML PO SOLN: 30.0000 mL | ORAL | Status: DC | PRN

## 2023-08-31 MED ORDER — TERBUTALINE SULFATE 1 MG/ML IJ SOLN
0.2500 mg | Freq: Once | INTRAMUSCULAR | Status: AC | PRN
  Administered 2023-09-01: 0.25 mg via SUBCUTANEOUS
  Filled 2023-08-31: qty 1

## 2023-08-31 MED ORDER — ACETAMINOPHEN 325 MG PO TABS
650.0000 mg | ORAL_TABLET | ORAL | Status: DC | PRN
Start: 2023-08-31 — End: 2023-09-02

## 2023-08-31 MED ORDER — OXYCODONE-ACETAMINOPHEN 5-325 MG PO TABS: 1.0000 | ORAL_TABLET | ORAL | Status: DC | PRN

## 2023-08-31 MED ORDER — OXYTOCIN BOLUS FROM INFUSION
333.0000 mL | Freq: Once | INTRAVENOUS | Status: AC
  Administered 2023-09-02: 333 mL via INTRAVENOUS

## 2023-08-31 MED ORDER — CEFAZOLIN SODIUM-DEXTROSE 2-4 GM/100ML-% IV SOLN
2.0000 g | Freq: Once | INTRAVENOUS | Status: AC
  Administered 2023-08-31 (×2): 2 g via INTRAVENOUS
  Filled 2023-08-31: qty 100

## 2023-08-31 MED ORDER — LIDOCAINE HCL (PF) 1 % IJ SOLN: 30.0000 mL | INTRAMUSCULAR | Status: DC | PRN

## 2023-08-31 MED ORDER — ONDANSETRON HCL 4 MG/2ML IJ SOLN
4.0000 mg | Freq: Four times a day (QID) | INTRAMUSCULAR | Status: DC | PRN
  Administered 2023-09-01 – 2023-09-02 (×2): 4 mg via INTRAVENOUS
  Filled 2023-08-31 (×2): qty 2
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
## 2023-08-31 NOTE — MAU Provider Note (Signed)
 MAU Note   S: Ms. Toni Moss is a 23 y.o. G1P0000 at [redacted]w[redacted]d  who presents to MAU today complaining of leaking of fluid since 1300. She denies vaginal bleeding. She endorses rare contractions. She reports normal fetal movement.    O: BP 108/82 (BP Location: Right Arm)   Pulse (!) 137   Temp 98.4 F (36.9 C) (Oral)   Resp 16   Ht 5' 5 (1.651 m)   Wt 91.2 kg   LMP 12/07/2022 (Exact Date)   SpO2 99%   BMI 33.46 kg/m   GENERAL: Well-developed, well-nourished female in no acute distress.  HEAD: Normocephalic, atraumatic.  CHEST: Normal effort of breathing, regular heart rate ABDOMEN: Soft, nontender, gravid  Vertex by BSUS  Fetal Monitoring: Baseline: 150 Variability: moderate Accelerations: present Decelerations: absent Contractions: rare  Results for orders placed or performed during the hospital encounter of 08/31/23 (from the past 24 hours)  Fern Test     Status: None   Collection Time: 08/31/23  4:54 PM  Result Value Ref Range   POCT Fern Test Negative = intact amniotic membranes   Wet prep, genital     Status: Abnormal   Collection Time: 08/31/23  5:40 PM   Specimen: Vaginal  Result Value Ref Range   Yeast Wet Prep HPF POC NONE SEEN NONE SEEN   Trich, Wet Prep NONE SEEN NONE SEEN   Clue Cells Wet Prep HPF POC NONE SEEN NONE SEEN   WBC, Wet Prep HPF POC >=10 (A) <10   Sperm NONE SEEN   Rupture of Membrane (ROM) Plus     Status: None   Collection Time: 08/31/23  5:40 PM  Result Value Ref Range   Rom Plus POSITIVE      A: SIUP at [redacted]w[redacted]d  SROM  P: Admit to LD  Nicholaus Almarie HERO, MD 08/31/2023 7:05 PM

## 2023-08-31 NOTE — MAU Note (Signed)
 Toni Moss is a 23 y.o. at [redacted]w[redacted]d here in MAU reporting: sat down on the toilet and a gush of fluid came out. Has felt like dripping out since then. Clear fluid.  Doesn't have a pad on.  Pants are moist, like after you've been sweating.  Had some pains, but not regular.really tight cramping.no bleeding. +FM Closed on Monday. Onset of complaint: 1300 Pain score:5/10 Vitals:   08/31/23 1615  BP: 114/75  Pulse: (!) 136  Resp: 16  Temp: 98.6 F (37 C)  SpO2: 99%     FHT:150 Lab orders placed from triage:  fern

## 2023-08-31 NOTE — Progress Notes (Signed)
 Patient ID: Toni Moss, female   DOB: 2000-09-08, 23 y.o.   MRN: [id number]  Had gush ~1300 and not a whole lot of leaking since; not feeling ctx  BP 108/82, P 130s FHR 140s, +accels, no decels, Cat 1 Ctx irreg, mild Cx 1/thick/vtx -2  IUP@38 .1wks PROM Cx unfavorable Hx of frequent sinus tach  Cervical foley inserted without difficulty and inflated with 60cc fluid; buccal cytotec  50mcg also given; plan for Pitocin  when foley dislodges  [name] Gentry [facility] 08/31/2023 10:06 PM

## 2023-09-01 ENCOUNTER — Inpatient Hospital Stay (HOSPITAL_COMMUNITY): Admitting: Anesthesiology

## 2023-09-01 LAB — CBC WITH DIFFERENTIAL/PLATELET
Abs Immature Granulocytes: 0.12 K/uL — ABNORMAL HIGH (ref 0.00–0.07)
Basophils Absolute: 0 K/uL (ref 0.0–0.1)
Basophils Relative: 0 %
Eosinophils Absolute: 0 K/uL (ref 0.0–0.5)
Eosinophils Relative: 0 %
HCT: 30.5 % — ABNORMAL LOW (ref 36.0–46.0)
Hemoglobin: 9.8 g/dL — ABNORMAL LOW (ref 12.0–15.0)
Immature Granulocytes: 1 %
Lymphocytes Relative: 15 %
Lymphs Abs: 2.9 K/uL (ref 0.7–4.0)
MCH: 28.3 pg (ref 26.0–34.0)
MCHC: 32.1 g/dL (ref 30.0–36.0)
MCV: 88.2 fL (ref 80.0–100.0)
Monocytes Absolute: 1.2 K/uL — ABNORMAL HIGH (ref 0.1–1.0)
Monocytes Relative: 6 %
Neutro Abs: 15.5 K/uL — ABNORMAL HIGH (ref 1.7–7.7)
Neutrophils Relative %: 78 %
Platelets: 244 K/uL (ref 150–400)
RBC: 3.46 MIL/uL — ABNORMAL LOW (ref 3.87–5.11)
RDW: 15.1 % (ref 11.5–15.5)
WBC: 19.7 K/uL — ABNORMAL HIGH (ref 4.0–10.5)
nRBC: 0 % (ref 0.0–0.2)

## 2023-09-01 LAB — COMPREHENSIVE METABOLIC PANEL WITH GFR
ALT: 8 U/L (ref 0–44)
AST: 15 U/L (ref 15–41)
Albumin: 2.3 g/dL — ABNORMAL LOW (ref 3.5–5.0)
Alkaline Phosphatase: 146 U/L — ABNORMAL HIGH (ref 38–126)
Anion gap: 11 (ref 5–15)
BUN: 6 mg/dL (ref 6–20)
CO2: 18 mmol/L — ABNORMAL LOW (ref 22–32)
Calcium: 7.9 mg/dL — ABNORMAL LOW (ref 8.9–10.3)
Chloride: 104 mmol/L (ref 98–111)
Creatinine, Ser: 0.65 mg/dL (ref 0.44–1.00)
GFR, Estimated: >60 mL/min (ref >60)
Glucose, Bld: 80 mg/dL (ref 70–99)
Potassium: 2.9 mmol/L — ABNORMAL LOW (ref 3.5–5.1)
Sodium: 133 mmol/L — ABNORMAL LOW (ref 135–145)
Total Bilirubin: 0.9 mg/dL (ref 0.0–1.2)
Total Protein: 5.4 g/dL — ABNORMAL LOW (ref 6.5–8.1)

## 2023-09-01 LAB — RPR: RPR Ser Ql: NONREACTIVE

## 2023-09-01 MED ORDER — LAMOTRIGINE 25 MG PO TABS
25.0000 mg | ORAL_TABLET | Freq: Every day | ORAL | Status: DC
  Filled 2023-09-01: qty 1

## 2023-09-01 MED ORDER — LIDOCAINE HCL URETHRAL/MUCOSAL 2 % EX GEL
1.0000 | Freq: Once | CUTANEOUS | Status: AC
  Administered 2023-09-01: 1 via URETHRAL
  Filled 2023-09-01: qty 6

## 2023-09-01 MED ORDER — LACTATED RINGERS AMNIOINFUSION: INTRAVENOUS | Status: DC

## 2023-09-01 MED ORDER — BUPIVACAINE HCL (PF) 0.25 % IJ SOLN
INTRAMUSCULAR | Status: DC | PRN
  Administered 2023-09-01: 8 mL via EPIDURAL

## 2023-09-01 MED ORDER — LIDOCAINE HCL (PF) 1 % IJ SOLN
INTRAMUSCULAR | Status: DC | PRN
  Administered 2023-09-01: 3 mL via SUBCUTANEOUS

## 2023-09-01 MED ORDER — FENTANYL-BUPIVACAINE-NACL 0.5-0.125-0.9 MG/250ML-% EP SOLN
12.0000 mL/h | EPIDURAL | Status: DC | PRN
  Administered 2023-09-01 (×2): 12 mL/h via EPIDURAL
  Filled 2023-09-01 (×2): qty 250

## 2023-09-01 MED ORDER — DIPHENHYDRAMINE HCL 50 MG/ML IJ SOLN: 12.5000 mg | INTRAMUSCULAR | Status: DC | PRN

## 2023-09-01 MED ORDER — EPHEDRINE 5 MG/ML INJ: 10.0000 mg | INTRAVENOUS | Status: DC | PRN

## 2023-09-01 MED ORDER — OXYTOCIN-SODIUM CHLORIDE 30-0.9 UT/500ML-% IV SOLN
1.0000 m[IU]/min | INTRAVENOUS | Status: DC
  Administered 2023-09-01: 2 m[IU]/min via INTRAVENOUS
  Filled 2023-09-01 (×2): qty 500

## 2023-09-01 MED ORDER — PHENYLEPHRINE 80 MCG/ML (10ML) SYRINGE FOR IV PUSH (FOR BLOOD PRESSURE SUPPORT)
80.0000 ug | PREFILLED_SYRINGE | INTRAVENOUS | Status: DC | PRN
  Administered 2023-09-01: 80 ug via INTRAVENOUS
  Filled 2023-09-01: qty 10

## 2023-09-01 MED ORDER — PHENYLEPHRINE 80 MCG/ML (10ML) SYRINGE FOR IV PUSH (FOR BLOOD PRESSURE SUPPORT): 80.0000 ug | PREFILLED_SYRINGE | INTRAVENOUS | Status: DC | PRN

## 2023-09-01 MED ORDER — TERBUTALINE SULFATE 1 MG/ML IJ SOLN: 0.2500 mg | Freq: Once | INTRAMUSCULAR | Status: DC | PRN

## 2023-09-01 MED ORDER — LACTATED RINGERS IV SOLN: 500.0000 mL | Freq: Once | INTRAVENOUS | Status: DC

## 2023-09-01 MED ORDER — LIDOCAINE-EPINEPHRINE (PF) 2 %-1:200000 IJ SOLN
INTRAMUSCULAR | Status: DC | PRN
  Administered 2023-09-01: 3 mL via EPIDURAL

## 2023-09-01 NOTE — Plan of Care (Signed)
  Problem: Education: Goal: Knowledge of Childbirth will improve Outcome: Progressing Goal: Ability to make informed decisions regarding treatment and plan of care will improve Outcome: Progressing Goal: Ability to state and carry out methods to decrease the pain will improve Outcome: Progressing Goal: Individualized Educational Video(s) Outcome: Progressing   Problem: Coping: Goal: Ability to verbalize concerns and feelings about labor and delivery will improve Outcome: Progressing   Problem: Life Cycle: Goal: Ability to make normal progression through stages of labor will improve Outcome: Progressing Goal: Ability to effectively push during vaginal delivery will improve Outcome: Progressing   Problem: Role Relationship: Goal: Will demonstrate positive interactions with the child Outcome: Progressing   Problem: Safety: Goal: Risk of complications during labor and delivery will decrease Outcome: Progressing   Problem: Pain Management: Goal: Relief or control of pain from uterine contractions will improve Outcome: Progressing   Problem: Education: Goal: Knowledge of General Education information will improve Description: Including pain rating scale, medication(s)/side effects and non-pharmacologic comfort measures Outcome: Progressing   Problem: Health Behavior/Discharge Planning: Goal: Ability to manage health-related needs will improve Outcome: Progressing   Problem: Clinical Measurements: Goal: Ability to maintain clinical measurements within normal limits will improve Outcome: Progressing Goal: Will remain free from infection Outcome: Progressing Goal: Diagnostic test results will improve Outcome: Progressing Goal: Respiratory complications will improve Outcome: Progressing Goal: Cardiovascular complication will be avoided Outcome: Progressing   Problem: Activity: Goal: Risk for activity intolerance will decrease Outcome: Progressing   Problem:  Nutrition: Goal: Adequate nutrition will be maintained Outcome: Progressing   Problem: Coping: Goal: Level of anxiety will decrease Outcome: Progressing   Problem: Elimination: Goal: Will not experience complications related to bowel motility Outcome: Progressing Goal: Will not experience complications related to urinary retention Outcome: Progressing   Problem: Pain Managment: Goal: General experience of comfort will improve and/or be controlled Outcome: Progressing   Problem: Safety: Goal: Ability to remain free from injury will improve Outcome: Progressing   Problem: Skin Integrity: Goal: Risk for impaired skin integrity will decrease Outcome: Progressing

## 2023-09-01 NOTE — Progress Notes (Signed)
 Labor Progress Note Toni Moss is a 23 y.o. G1P0000 at [redacted]w[redacted]d presented for ROM.  S: Haven is doing well, resting comfortably.   O:  BP (!) 107/58   Pulse (!) 121   Temp 98.5 F (36.9 C) (Oral)   Resp 17   Ht 5' 5 (1.651 m)   Wt 91.2 kg   LMP 12/07/2022 (Exact Date)   SpO2 100%   BMI 33.46 kg/m  EFM: 150/moderate variability/accels present, prolonged decelerations present  CVE: Dilation: 5 Effacement (%): 90 Cervical Position: Posterior Station: -2 Presentation: Vertex Exam by:: Dr. Magali   A&P: 23 y.o. G1P0000 [redacted]w[redacted]d here due to ROM.  #Labor: When provider walked into the room, patient continued to have long deceleration with FHR in the 90's. Cervix rechecked and had thinned out but was still 5cm. FSE placed. Decels continued with position changes, maternal fluid bolus and amnioinfusion were running. Pit was turned off and terbutaline  given. FHR improved back to baseline. Will keep pitocin  off and monitor tracing for next two hours and assess for cervical change.  #Pain: Epidural in place.  #FWB: Cat II tracing  #GBS positive, adequately treated    Barkley Magali, MD OB Fellow, Faculty Practice [facility], Center for [employer]

## 2023-09-01 NOTE — Progress Notes (Signed)
 Patient ID: Toni Moss, female   DOB: [birth year], 23 y.o.   MRN: [id number]  Comfortable w epidural; s/p cervical foley/cyto with Pit started @ 0400  BPs 112/63, 92/56 FHR 120-130s, +accels, no decels Ctx irreg 2-7 mins with Pit at 73mu/min Cx was 4/50/vtx -2 @ start of Pit  IUP@38 .2wks PROM IOL process  -Continue to uptitrate Pit to achieve active labor; at next exam, eval for forebag -Anticipate vag delivery  [name] Gentry CNM 09/01/2023 5:50 AM

## 2023-09-01 NOTE — Progress Notes (Signed)
 Toni Moss is a 23 y.o. G1P0000 at [redacted]w[redacted]d by LMP admitted for SROM.  Subjective: Resting in bed on right side with peanut ball between legs. Comfortable with epidural.  Objective: BP 105/64   Pulse 94   Temp 98.6 F (37 C) (Oral)   Resp 18   Ht 5' 5 (1.651 m)   Wt 91.2 kg   LMP 12/07/2022 (Exact Date)   SpO2 97%   BMI 33.46 kg/m  No intake/output data recorded. Total I/O In: -  Out: 575 [Urine:575]  Fetal Tracing: Baseline: 145 Variability: moderate Accelerations: 15x15 Decelerations: variable  UC: not tracing well on TOCO, nursing to readjust  SVE:   Dilation: 5 Effacement (%): 60 Station: -2 Exam by:: Vernell Morin CNM  Labs: Lab Results  Component Value Date   WBC 20.0 (H) 08/31/2023   HGB 11.6 (L) 08/31/2023   HCT 35.4 (L) 08/31/2023   MCV 87.4 08/31/2023   PLT 316 08/31/2023    Assessment / Plan: IOL due to SROM yesterday at approx 1330.  Labor: Progressing on Pitocin , minimal cervical change from 4 to 5 cm since 0400. Bulging forebag on exam with very well applied fetal head. Discussed AROM of forebag, pt agreeable. AROM successful on first attempt with [place]. Small amount of light meconium stained fluid returned. Fetal head descended from -3 to -2. Variable decel shortly following AROM, no cord palpated in vagina. Patient repositioned and quick recovery of fetal heart rate seen. Consider IUPC at next check if minimal cervical change again. Fetal Wellbeing: Cat 2 due to decels; reassured by quick recovery following position change, accels present, and moderate variability. Will closely monitor. Pain Control:  Epidural I/D:  GBS pos > Ancef  Anticipated MOD:  NSVD  Vernell FORBES Ruddle, Student-MidWife 09/01/2023, [phone]

## 2023-09-01 NOTE — Anesthesia Preprocedure Evaluation (Signed)
 Anesthesia Evaluation  Patient identified by MRN, date of birth, ID band Patient awake    Reviewed: Allergy & Precautions, NPO status , Patient's Chart, lab work & pertinent test results  History of Anesthesia Complications Negative for: history of anesthetic complications  Airway Mallampati: II  TM Distance: >3 FB Neck ROM: Full    Dental no notable dental hx. (+) Teeth Intact   Pulmonary neg pulmonary ROS, neg sleep apnea, neg COPD, Patient abstained from smoking.Not current smoker   Pulmonary exam normal breath sounds clear to auscultation       Cardiovascular Exercise Tolerance: Good METS(-) hypertension(-) CAD and (-) Past MI negative cardio ROS (-) dysrhythmias  Rhythm:Regular Rate:Normal - Systolic murmurs    Neuro/Psych  PSYCHIATRIC DISORDERS Anxiety Depression    negative neurological ROS     GI/Hepatic ,neg GERD  ,,(+)     (-) substance abuse    Endo/Other  neg diabetes    Renal/GU negative Renal ROS     Musculoskeletal Patient states she has mild scoliosis   Abdominal   Peds  Hematology Denies blood thinner use or bleeding disorders.    Anesthesia Other Findings Denies blood thinner use or bleeding diatheses. Recent labs reviewed. 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 No date: Personality disorder (HCC) No date: PTSD (post-traumatic stress disorder) No date: Tic disorder   Reproductive/Obstetrics (+) Pregnancy                              Anesthesia Physical Anesthesia Plan  ASA: 2  Anesthesia Plan: Epidural   Post-op Pain  Management:    Induction:   PONV Risk Score and Plan: 2 and Treatment may vary due to age or medical condition and Ondansetron   Airway Management Planned: Natural Airway  Additional Equipment:   Intra-op Plan:   Post-operative Plan:   Informed Consent: I have reviewed the patients History and Physical, chart, labs and discussed the procedure including the risks, benefits and alternatives for the proposed anesthesia with the patient or authorized representative who has indicated his/her understanding and acceptance.       Plan Discussed with: Surgeon  Anesthesia Plan Comments: (Discussed R/B/A of neuraxial anesthesia technique with patient: - rare risks of spinal/epidural hematoma, nerve damage, infection - Risk of PDPH - Risk of itching - Risk of nausea and vomiting - Risk of poor block necessitating replacement of epidural. - Risk of allergic reactions. Discussed potential difficulty/poor epidural increased risk if patient has scoliosis. Patient voiced understanding.)        Anesthesia Quick Evaluation

## 2023-09-01 NOTE — Progress Notes (Addendum)
 BP (!) 95/54   Pulse (!) 104   Temp 98.4 F (36.9 C) (Oral)   Resp 17   Ht 5' 5 (1.651 m)   Wt 91.2 kg   LMP 12/07/2022 (Exact Date)   SpO2 99%   BMI 33.46 kg/m   Dilation: 5 Effacement (%): 60 Cervical Position: Posterior Station: -2 Presentation: Vertex Exam by:: Vernell Ruddle SCNM  Patient resting comfortably. Has reported feeling increased rectal pressure since AROM earlier today. Nursing staff has continued frequent position changes. Discussed placement of IUPC. Cervix unchanged on SVE. IUPC placed without difficulty; shows UC q2-4 minutes at time of placement. Patient tolerated well. FHR Cat 2 due to variable and late decels, tracing continues to have moderate variability and accels are present. Decrease Pitocin  by 4mu/min; now infusing at 8 mu/min. Discussed with patient if variable decels continue, may start amnioinfusion. Plan to titrate Pitocin  to achieve adequate MVUs.  Vernell FORBES Ruddle, Student-MidWife 09/01/2023, 4:35 PM

## 2023-09-01 NOTE — Anesthesia Procedure Notes (Signed)
 Epidural Patient location during procedure: OB Start time: 09/01/2023 2:00 AM End time: 09/01/2023 2:06 AM  Staffing Anesthesiologist: Boone Fess, MD Performed: anesthesiologist   Preanesthetic Checklist Completed: patient identified, IV checked, site marked, risks and benefits discussed, surgical consent, monitors and equipment checked, pre-op evaluation and timeout performed  Epidural Patient position: sitting Prep: ChloraPrep Patient monitoring: heart rate, continuous pulse ox and blood pressure Approach: midline Location: L4-L5 Injection technique: LOR saline  Needle:  Needle type: Tuohy  Needle gauge: 17 G Needle length: 9 cm Needle insertion depth: 7 cm Catheter type: closed end flexible Catheter size: 19 Gauge Catheter at skin depth: 12 cm Test dose: negative and 1.5% lidocaine  with Epi 1:200 K  Assessment Sensory level: T10 Events: blood not aspirated, no cerebrospinal fluid, injection not painful, no injection resistance, no paresthesia and negative IV test  Additional Notes First/one attempt. No significant scoliosis appreciated.  Pt. Evaluated and documentation done after procedure finished. Patient identified. Risks/Benefits/Options discussed with patient including but not limited to bleeding, infection, nerve damage, paralysis, failed block, incomplete pain control, headache, blood pressure changes, nausea, vomiting, reactions to medication both or allergic, itching and postpartum back pain. Confirmed with bedside nurse the patient's most recent platelet count. Confirmed with patient that they are not currently taking any anticoagulation, have any bleeding history or any family history of bleeding disorders. Patient expressed understanding and wished to proceed. All questions were answered. Sterile technique was used throughout the entire procedure. Please see nursing notes for vital signs. Test dose was given through epidural catheter and negative prior to  continuing to dose epidural or start infusion. Warning signs of high block given to the patient including shortness of breath, tingling/numbness in hands, complete motor block, or any concerning symptoms with instructions to call for help. Patient was given instructions on fall risk and not to get out of bed. All questions and concerns addressed with instructions to call with any issues or inadequate analgesia.     Patient tolerated the insertion well without immediate complications.  Reason for block: procedure for painReason for block:procedure for pain

## 2023-09-02 ENCOUNTER — Encounter (HOSPITAL_COMMUNITY): Payer: Self-pay | Admitting: Obstetrics & Gynecology

## 2023-09-02 DIAGNOSIS — O99344 Other mental disorders complicating childbirth: Secondary | ICD-10-CM

## 2023-09-02 DIAGNOSIS — O99820 Streptococcus B carrier state complicating pregnancy: Secondary | ICD-10-CM

## 2023-09-02 DIAGNOSIS — Z3A38 38 weeks gestation of pregnancy: Secondary | ICD-10-CM

## 2023-09-02 DIAGNOSIS — Z30017 Encounter for initial prescription of implantable subdermal contraceptive: Secondary | ICD-10-CM

## 2023-09-02 MED ORDER — SODIUM CHLORIDE 0.9% FLUSH
3.0000 mL | Freq: Two times a day (BID) | INTRAVENOUS | Status: DC
  Administered 2023-09-03: 3 mL via INTRAVENOUS

## 2023-09-02 MED ORDER — SODIUM CHLORIDE 0.9% FLUSH: 3.0000 mL | INTRAVENOUS | Status: DC | PRN

## 2023-09-02 MED ORDER — IBUPROFEN 800 MG PO TABS
800.0000 mg | ORAL_TABLET | Freq: Three times a day (TID) | ORAL | Status: DC
  Administered 2023-09-02 – 2023-09-04 (×7): 800 mg via ORAL
  Filled 2023-09-02 (×7): qty 1

## 2023-09-02 MED ORDER — ONDANSETRON HCL 4 MG/2ML IJ SOLN: 4.0000 mg | INTRAMUSCULAR | Status: DC | PRN

## 2023-09-02 MED ORDER — CEFAZOLIN SODIUM-DEXTROSE 2-4 GM/100ML-% IV SOLN
2.0000 g | Freq: Three times a day (TID) | INTRAVENOUS | Status: DC
  Filled 2023-09-02: qty 100

## 2023-09-02 MED ORDER — PRENATAL MULTIVITAMIN CH
1.0000 | ORAL_TABLET | Freq: Every day | ORAL | Status: DC
  Administered 2023-09-02 – 2023-09-04 (×3): 1 via ORAL
  Filled 2023-09-02 (×3): qty 1

## 2023-09-02 MED ORDER — DIPHENHYDRAMINE HCL 25 MG PO CAPS: 25.0000 mg | ORAL_CAPSULE | Freq: Four times a day (QID) | ORAL | Status: DC | PRN

## 2023-09-02 MED ORDER — MEASLES, MUMPS & RUBELLA VAC IJ SOLR
0.5000 mL | Freq: Once | INTRAMUSCULAR | Status: AC
  Administered 2023-09-04: 0.5 mL via SUBCUTANEOUS
  Filled 2023-09-02: qty 0.5

## 2023-09-02 MED ORDER — SENNOSIDES-DOCUSATE SODIUM 8.6-50 MG PO TABS
2.0000 | ORAL_TABLET | ORAL | Status: DC
  Administered 2023-09-02 – 2023-09-04 (×3): 2 via ORAL
  Filled 2023-09-02 (×3): qty 2

## 2023-09-02 MED ORDER — SODIUM CHLORIDE 0.9 % IV SOLN: 250.0000 mL | INTRAVENOUS | Status: DC | PRN

## 2023-09-02 MED ORDER — CEFAZOLIN SODIUM-DEXTROSE 2-4 GM/100ML-% IV SOLN
2.0000 g | Freq: Once | INTRAVENOUS | Status: AC
  Administered 2023-09-02: 2 g via INTRAVENOUS
  Filled 2023-09-02: qty 100

## 2023-09-02 MED ORDER — BENZOCAINE-MENTHOL 20-0.5 % EX AERO: 1.0000 | INHALATION_SPRAY | CUTANEOUS | Status: DC | PRN

## 2023-09-02 MED ORDER — GENTAMICIN SULFATE 40 MG/ML IJ SOLN
5.0000 mg/kg | INTRAVENOUS | Status: DC
  Administered 2023-09-02: 350 mg via INTRAVENOUS
  Filled 2023-09-02: qty 8.75

## 2023-09-02 MED ORDER — ONDANSETRON HCL 4 MG PO TABS: 4.0000 mg | ORAL_TABLET | ORAL | Status: DC | PRN

## 2023-09-02 MED ORDER — ACETAMINOPHEN 325 MG PO TABS
650.0000 mg | ORAL_TABLET | ORAL | Status: DC | PRN
  Administered 2023-09-02: 650 mg via ORAL
  Filled 2023-09-02: qty 2

## 2023-09-02 MED ORDER — COCONUT OIL OIL
1.0000 | TOPICAL_OIL | Status: DC | PRN
  Administered 2023-09-03: 1 via TOPICAL

## 2023-09-02 MED ORDER — TETANUS-DIPHTH-ACELL PERTUSSIS 5-2.5-18.5 LF-MCG/0.5 IM SUSY
0.5000 mL | PREFILLED_SYRINGE | Freq: Once | INTRAMUSCULAR | Status: DC
Start: 2023-09-03 — End: 2023-09-04

## 2023-09-02 MED ORDER — DIBUCAINE (PERIANAL) 1 % EX OINT
1.0000 | TOPICAL_OINTMENT | CUTANEOUS | Status: DC | PRN
Start: 2023-09-02 — End: 2023-09-04

## 2023-09-02 MED ORDER — WITCH HAZEL-GLYCERIN EX PADS: 1.0000 | MEDICATED_PAD | CUTANEOUS | Status: DC | PRN

## 2023-09-02 MED ORDER — SIMETHICONE 80 MG PO CHEW: 80.0000 mg | CHEWABLE_TABLET | ORAL | Status: DC | PRN

## 2023-09-02 NOTE — Progress Notes (Signed)
 Labor Progress Note HUNTER PINKARD is a 23 y.o. G1P0000 at [redacted]w[redacted]d presented for ROM.  S: Haven is doing well, resting comfortably.   O:  BP 112/77   Pulse (!) 131   Temp 100 F (37.8 C) (Axillary)   Resp 18   Ht 5' 5 (1.651 m)   Wt 91.2 kg   LMP 12/07/2022 (Exact Date)   SpO2 100%   BMI 33.46 kg/m  EFM: 160/moderate variability/accels absent, mild variables present   CVE: Dilation: 5 Effacement (%): 90 Cervical Position: Posterior Station: -2 Presentation: Vertex Exam by:: Dr. Magali   A&P: 23 y.o. G1P0000 [redacted]w[redacted]d here due to ROM.  #Labor: Patient having intermittent episodes of fetal tachycardia. This with increased maternal temperature and white count, presumptive diagnosis made of Triple-I and started on cefazolin  and gentamicin .  #Pain: Epidural in place.  #FWB: Cat II tracing  #GBS positive, adequately treated    Barkley Magali, MD OB Fellow, Faculty Practice [facility], Center for [employer]

## 2023-09-02 NOTE — Anesthesia Postprocedure Evaluation (Signed)
 Anesthesia Post Note  Patient: Toni Moss  Procedure(s) Performed: AN AD HOC LABOR EPIDURAL     Patient location during evaluation: Mother Baby Anesthesia Type: Epidural Level of consciousness: awake and alert and oriented Pain management: satisfactory to patient Vital Signs Assessment: post-procedure vital signs reviewed and stable Respiratory status: respiratory function stable Cardiovascular status: stable Postop Assessment: no headache, no backache, epidural receding, patient able to bend at knees, no signs of nausea or vomiting, adequate PO intake and able to ambulate Anesthetic complications: no   No notable events documented.  Last Vitals:  Vitals:   09/02/23 0615 09/02/23 0714  BP: 122/77 (P) 116/79  Pulse: 99 (P) 85  Resp: 17 20  Temp: 36.9 C (P) 37.2 C  SpO2: 100%     Last Pain:  Vitals:   09/02/23 0800  TempSrc:   PainSc: Asleep   Pain Goal: Patients Stated Pain Goal: 0 (08/31/23 2234)                 Hoy LITTIE Sharps

## 2023-09-02 NOTE — Lactation Note (Signed)
 This note was copied from a baby's chart. Lactation Consultation Note  Patient Name: Toni Moss Unijb'd Date: 09/02/2023 Age:21 hours  Mom declines Lactation services. LC asked mom if she wanted to see Lactation while she was here and mom said No. LC asked mom what was her feeding plan mom stated both breast and formula but mainly formula. LC asked mom you don't want Lactation to help you BF, mom stated no.    Maternal Data    Feeding    LATCH Score                    Lactation Tools Discussed/Used    Interventions    Discharge    Consult Status Consult Status: Complete    Evanee Lubrano G 09/02/2023, 6:27 AM

## 2023-09-02 NOTE — Plan of Care (Signed)
 Problem: Education: Goal: Knowledge of Childbirth will improve 09/02/2023 0518 by Ellena Lucienne BIRCH, RN Outcome: Completed/Met 09/01/2023 1942 by Ellena Lucienne BIRCH, RN Outcome: Progressing Goal: Ability to make informed decisions regarding treatment and plan of care will improve 09/02/2023 0518 by Ellena Lucienne BIRCH, RN Outcome: Completed/Met 09/01/2023 1942 by Ellena Lucienne BIRCH, RN Outcome: Progressing Goal: Ability to state and carry out methods to decrease the pain will improve 09/02/2023 0518 by Ellena Lucienne BIRCH, RN Outcome: Completed/Met 09/01/2023 1942 by Ellena Lucienne BIRCH, RN Outcome: Progressing Goal: Individualized Educational Video(s) 09/02/2023 0518 by Ellena Lucienne BIRCH, RN Outcome: Completed/Met 09/01/2023 1942 by Ellena Lucienne BIRCH, RN Outcome: Progressing   Problem: Coping: Goal: Ability to verbalize concerns and feelings about labor and delivery will improve 09/02/2023 0518 by Ellena Lucienne BIRCH, RN Outcome: Completed/Met 09/01/2023 1942 by Ellena Lucienne BIRCH, RN Outcome: Progressing   Problem: Life Cycle: Goal: Ability to make normal progression through stages of labor will improve 09/02/2023 0518 by Ellena Lucienne BIRCH, RN Outcome: Completed/Met 09/01/2023 1942 by Ellena Lucienne BIRCH, RN Outcome: Progressing Goal: Ability to effectively push during vaginal delivery will improve 09/02/2023 0518 by Ellena Lucienne BIRCH, RN Outcome: Completed/Met 09/01/2023 1942 by Ellena Lucienne BIRCH, RN Outcome: Progressing   Problem: Role Relationship: Goal: Will demonstrate positive interactions with the child 09/02/2023 0518 by Ellena Lucienne BIRCH, RN Outcome: Completed/Met 09/01/2023 1942 by Ellena Lucienne BIRCH, RN Outcome: Progressing   Problem: Safety: Goal: Risk of complications during labor and delivery will decrease 09/02/2023 0518 by Ellena Lucienne BIRCH, RN Outcome: Completed/Met 09/01/2023 1942 by Ellena Lucienne BIRCH, RN Outcome: Progressing   Problem:  Pain Management: Goal: Relief or control of pain from uterine contractions will improve 09/02/2023 0518 by Ellena Lucienne BIRCH, RN Outcome: Completed/Met 09/01/2023 1942 by Ellena Lucienne BIRCH, RN Outcome: Progressing   Problem: Education: Goal: Knowledge of General Education information will improve Description: Including pain rating scale, medication(s)/side effects and non-pharmacologic comfort measures 09/02/2023 0518 by Ellena Lucienne BIRCH, RN Outcome: Completed/Met 09/01/2023 1942 by Ellena Lucienne BIRCH, RN Outcome: Progressing   Problem: Health Behavior/Discharge Planning: Goal: Ability to manage health-related needs will improve 09/02/2023 0518 by Ellena Lucienne BIRCH, RN Outcome: Completed/Met 09/01/2023 1942 by Ellena Lucienne BIRCH, RN Outcome: Progressing   Problem: Clinical Measurements: Goal: Ability to maintain clinical measurements within normal limits will improve 09/02/2023 0518 by Ellena Lucienne BIRCH, RN Outcome: Completed/Met 09/01/2023 1942 by Ellena Lucienne BIRCH, RN Outcome: Progressing Goal: Will remain free from infection 09/02/2023 0518 by Ellena Lucienne BIRCH, RN Outcome: Completed/Met 09/01/2023 1942 by Ellena Lucienne BIRCH, RN Outcome: Progressing Goal: Diagnostic test results will improve 09/02/2023 0518 by Ellena Lucienne BIRCH, RN Outcome: Completed/Met 09/01/2023 1942 by Ellena Lucienne BIRCH, RN Outcome: Progressing Goal: Respiratory complications will improve 09/02/2023 0518 by Ellena Lucienne BIRCH, RN Outcome: Completed/Met 09/01/2023 1942 by Ellena Lucienne BIRCH, RN Outcome: Progressing Goal: Cardiovascular complication will be avoided 09/02/2023 0518 by Valmai Vandenberghe D, RN Outcome: Completed/Met 09/01/2023 1942 by Ellena Lucienne BIRCH, RN Outcome: Progressing   Problem: Activity: Goal: Risk for activity intolerance will decrease 09/02/2023 0518 by Ellena Lucienne BIRCH, RN Outcome: Completed/Met 09/01/2023 1942 by Ellena Lucienne BIRCH, RN Outcome:  Progressing   Problem: Nutrition: Goal: Adequate nutrition will be maintained 09/02/2023 0518 by Ellena Lucienne BIRCH, RN Outcome: Completed/Met 09/01/2023 1942 by Ellena Lucienne BIRCH, RN Outcome: Progressing   Problem: Coping: Goal: Level of anxiety will decrease 09/02/2023 0518 by Ellena Lucienne BIRCH, RN Outcome: Completed/Met 09/01/2023 1942 by Ellena Lucienne BIRCH, RN Outcome: Progressing   Problem: Elimination:  Goal: Will not experience complications related to bowel motility 09/02/2023 0518 by Ellena Lucienne BIRCH, RN Outcome: Completed/Met 09/01/2023 1942 by Ellena Lucienne BIRCH, RN Outcome: Progressing Goal: Will not experience complications related to urinary retention 09/02/2023 0518 by Ellena Lucienne BIRCH, RN Outcome: Completed/Met 09/01/2023 1942 by Ellena Lucienne BIRCH, RN Outcome: Progressing   Problem: Pain Managment: Goal: General experience of comfort will improve and/or be controlled 09/02/2023 0518 by Ellena Lucienne BIRCH, RN Outcome: Completed/Met 09/01/2023 1942 by Ellena Lucienne BIRCH, RN Outcome: Progressing   Problem: Safety: Goal: Ability to remain free from injury will improve 09/02/2023 0518 by Ellena Lucienne BIRCH, RN Outcome: Completed/Met 09/01/2023 1942 by Ellena Lucienne BIRCH, RN Outcome: Progressing   Problem: Skin Integrity: Goal: Risk for impaired skin integrity will decrease 09/02/2023 0518 by Ellena Lucienne BIRCH, RN Outcome: Completed/Met 09/01/2023 1942 by Ellena Lucienne BIRCH, RN Outcome: Progressing

## 2023-09-02 NOTE — Discharge Summary (Signed)
 Postpartum Discharge Summary     Patient Name: Toni Moss DOB: July 14, 2000 MRN: [id number]  Date of admission: 08/31/2023 Delivery date:09/02/2023 Delivering provider: MAGALI BARKLEY CROME Date of discharge: 09/04/2023  Admitting diagnosis: PROM (premature rupture of membranes) [O42.90] Intrauterine pregnancy: [redacted]w[redacted]d     Secondary diagnosis:  Principal Problem:   PROM (premature rupture of membranes) Active Problems:   Generalized anxiety disorder   Supervision of normal first pregnancy   Rubella non-immune status, antepartum   Rh negative state in antepartum period   Sinus tachycardia  Additional problems: N/A    Discharge diagnosis: Term Pregnancy Delivered                                              Post partum procedures:NA Augmentation: Pitocin , Cytotec , and IP Foley Complications: None  Hospital course: Onset of Labor With Vaginal Delivery      23 y.o. yo G1P1001 at [redacted]w[redacted]d was admitted in Latent Labor on 08/31/2023. Labor course was complicated by recurrent decelerations that required pitocin  to be stopped and started repeatedly.  Membrane Rupture Time/Date: 10:11 AM,09/01/2023  Delivery Method:Vaginal, Spontaneous Operative Delivery:N/A Episiotomy: None Lacerations:  2nd degree;Perineal Patient had a postpartum course complicated by nothing.  She is ambulating, tolerating a regular diet, passing flatus, and urinating well. Patient is discharged home in stable condition on 09/04/23.  Newborn Data: Birth date:09/02/2023 Birth time:2:52 AM Gender:Female Living status:Living Apgars:7 ,8  Weight:2930 g  Magnesium Sulfate received: No BMZ received: No Rhophylac :Yes MMR:No T-DaP:Given prenatally Flu: No RSV Vaccine received: No Transfusion:No  Immunizations received: Immunization History  Administered Date(s) Administered   DTaP 08/21/2004   DTaP / Hep B / IPV 03/19/2003, 05/27/2003, 07/24/2003   DTaP / IPV 05/28/2008   HIB (PRP-OMP) 03/19/2003, 05/27/2003,  07/24/2003, 01/21/2004   HPV 9-valent 06/02/2015, 11/08/2016   MMR 01/21/2004, 05/28/2008   Meningococcal Conjugate 08/19/2015   PFIZER(Purple Top)SARS-COV-2 Vaccination 06/28/2019, 08/24/2019   Pneumococcal Conjugate-13 03/19/2003, 05/27/2003, 07/24/2003, 01/21/2004   Rho (D) Immune Globulin  07/07/2023   Tdap 08/19/2015, 06/16/2023   Varicella 01/21/2004    Physical exam  Vitals:   09/03/23 0441 09/03/23 1645 09/03/23 2030 09/04/23 0549  BP: (P) 118/77 105/72 113/78 115/81  Pulse: (P) 94  93 90  Resp: 18 16 16 18   Temp:  98.2 F (36.8 C) 98.1 F (36.7 C) 98 F (36.7 C)  TempSrc:  Oral Oral Oral  SpO2: (P) 99% 99% 100% 100%  Weight:      Height:       General: alert, cooperative, and no distress Lochia: appropriate Uterine Fundus: firm Incision: N/A DVT Evaluation: No evidence of DVT seen on physical exam. Labs: Lab Results  Component Value Date   WBC 19.7 (H) 09/01/2023   HGB 9.8 (L) 09/01/2023   HCT 30.5 (L) 09/01/2023   MCV 88.2 09/01/2023   PLT 244 09/01/2023      Latest Ref Rng & Units 09/01/2023    9:32 PM  CMP  Glucose 70 - 99 mg/dL 80   BUN 6 - 20 mg/dL 6   Creatinine 9.55 - 8.99 mg/dL 9.34   Sodium 864 - 854 mmol/L 133   Potassium 3.5 - 5.1 mmol/L 2.9   Chloride 98 - 111 mmol/L 104   CO2 22 - 32 mmol/L 18   Calcium  8.9 - 10.3 mg/dL 7.9   Total Protein 6.5 - 8.1  g/dL 5.4   Total Bilirubin 0.0 - 1.2 mg/dL 0.9   Alkaline Phos 38 - 126 U/L 146   AST 15 - 41 U/L 15   ALT 0 - 44 U/L 8    Edinburgh Score:    09/02/2023   12:28 PM  Edinburgh Postnatal Depression Scale Screening Tool  I have been able to laugh and see the funny side of things. 0  I have looked forward with enjoyment to things. 0  I have blamed myself unnecessarily when things went wrong. 1  I have been anxious or worried for no good reason. 1  I have felt scared or panicky for no good reason. 0  Things have been getting on top of me. 0  I have been so unhappy that I have had  difficulty sleeping. 0  I have felt sad or miserable. 0  I have been so unhappy that I have been crying. 0  The thought of harming myself has occurred to me. 0  Edinburgh Postnatal Depression Scale Total 2   No data recorded  After visit meds:  Allergies as of 09/04/2023       Reactions   Prozac  [fluoxetine  Hcl] Anaphylaxis, Swelling   Throat swelling    Doxycycline  Nausea And Vomiting   Penicillins Hives   Phenergan  [promethazine ]    Makes Pt feels like she's going to pass out         Medication List     TAKE these medications    chlorhexidine  4 % external liquid Commonly known as: Hibiclens  Apply topically daily as needed.   ibuprofen  800 MG tablet Commonly known as: ADVIL  Take 1 tablet (800 mg total) by mouth every 8 (eight) hours.   lamoTRIgine  25 MG tablet Commonly known as: LaMICtal  Take 1 tablet (25 mg total) by mouth at bedtime.   omeprazole  20 MG capsule Commonly known as: PRILOSEC Take 1 capsule (20 mg total) by mouth daily.   Potassium 99 MG Tabs Take 99 mg by mouth daily.   PRENATAL VITAMIN PO Take 1 tablet by mouth daily.   propranolol  10 MG tablet Commonly known as: INDERAL  Take 1 tablet (10 mg total) by mouth at bedtime.         Discharge home in stable condition Infant Feeding: Bottle and Breast Infant Disposition:home with mother Discharge instruction: per After Visit Summary and Postpartum booklet. Activity: Advance as tolerated. Pelvic rest for 6 weeks.  Diet: routine diet Future Appointments: Future Appointments  Date Time Provider Department Center  09/28/2023 10:20 AM Sheena Pugh, DO [facility]  10/13/2023 10:30 AM Cresenzo-Dishmon, Cathlean, CNM [facility]   Follow up Visit:  Message sent 09/02/23 Nexplanon  done prior to DC   Camie Rote, MSN, CNM, RNC-OB Certified Nurse Midwife, [facility] 09/04/2023 7:39 AM

## 2023-09-03 MED ORDER — RHO D IMMUNE GLOBULIN 1500 UNIT/2ML IJ SOSY
300.0000 ug | PREFILLED_SYRINGE | Freq: Once | INTRAMUSCULAR | Status: AC
  Administered 2023-09-03: 300 ug via INTRAVENOUS
  Filled 2023-09-03: qty 2

## 2023-09-03 NOTE — Progress Notes (Signed)
 POSTPARTUM PROGRESS NOTE  Subjective: Toni Moss is a 23 y.o. G1P1001 s/p SVB at [redacted]w[redacted]d.  She reports she doing well. No acute events overnight. She denies any problems with ambulating, voiding or po intake. Denies nausea or vomiting. She has  passed flatus. Pain is well controlled.  Lochia is appropriate like a light period.  Objective: Blood pressure 110/74, pulse (!) 106, temperature 98.7 F (37.1 C), temperature source Oral, resp. rate 18, height 5' 5 (1.651 m), weight 91.2 kg, last menstrual period 12/07/2022, SpO2 99%, unknown if currently breastfeeding.  Physical Exam:  General: alert, cooperative and no distress Chest: no respiratory distress Abdomen: soft, non-tender  Uterine Fundus: firm, appropriately tender Extremities: No calf swelling or tenderness  trace edema  Recent Labs    08/31/23 1931 09/01/23 2132  HGB 11.6* 9.8*  HCT 35.4* 30.5*    Assessment/Plan: Toni Moss is a 23 y.o. G1P1001 s/p SVB at [redacted]w[redacted]d .  Routine Postpartum Care: Doing well, pain well-controlled.  -- Continue routine care, lactation support  -- Contraception: Nexplanon  -- Feeding: breast and bottle  Dispo: Plan for discharge 09/04/2023.  Camie Rote, MSN, CNM, RNC-OB Certified Nurse Midwife, [facility] 09/03/2023 4:19 AM
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
## 2023-09-04 LAB — RH IG WORKUP (INCLUDES ABO/RH)
Fetal Screen: NEGATIVE
Gestational Age(Wks): 38
Unit division: 0

## 2023-09-04 MED ORDER — ETONOGESTREL 68 MG ~~LOC~~ IMPL
68.0000 mg | DRUG_IMPLANT | Freq: Once | SUBCUTANEOUS | Status: AC
  Administered 2023-09-04: 68 mg via SUBCUTANEOUS
  Filled 2023-09-04: qty 1

## 2023-09-04 MED ORDER — LIDOCAINE HCL 1 % IJ SOLN
0.0000 mL | Freq: Once | INTRAMUSCULAR | Status: AC | PRN
  Administered 2023-09-04: 2 mL via INTRADERMAL
  Filled 2023-09-04: qty 20

## 2023-09-04 MED ORDER — IBUPROFEN 800 MG PO TABS: 800.0000 mg | ORAL_TABLET | Freq: Three times a day (TID) | ORAL | 0 refills | Status: DC

## 2023-09-04 NOTE — Procedures (Signed)
 Post-Placental Nexplanon  Insertion Procedure Note  Toni Moss is a 23 y.o. G1P1001 requesting Nexplanon  insertion.  Patient was identified. Informed consent was signed, signed copy in chart. A time-out was performed.    The insertion site was identified 8-10 cm (3-4 inches) from the medial epicondyle of the humerus and 3-5 cm (1.25-2 inches) posterior to (below) the sulcus (groove) between the biceps and triceps muscles of the patient's right arm and marked. The site was prepped and draped in the usual sterile fashion. Pt was prepped with alcohol swab and then injected with 2cc of lidocaine .  The site was prepped with betadine. Nexplanon  removed form packaging,  Device confirmed in needle, then inserted full length of needle and withdrawn per handbook instructions. Provider and patient verified presence of the implant in the woman's arm by palpation. Pt insertion site was covered with steristrips/adhesive bandage and pressure bandage. There was minimal blood loss. Patient tolerated procedure well.  Patient was given post procedure instructions and Nexplanon  user card with expiration date. Condoms were recommended for STI prevention. Patient was asked to keep the pressure dressing on for 24 hours to minimize bruising and keep the adhesive bandage on for 3-5 days. The patient verbalized understanding of the plan of care and agrees.    SN [id number] Exp 2027-06 Lot1 [id number]   Barabara Maier, DO FMOB Fellow, Faculty practice [facility], Center for [employer]

## 2023-09-05 ENCOUNTER — Encounter: Admitting: Women's Health

## 2023-09-12 ENCOUNTER — Encounter: Admitting: Women's Health

## 2023-09-14 ENCOUNTER — Telehealth (HOSPITAL_COMMUNITY): Payer: Self-pay | Admitting: *Deleted

## 2023-09-14 NOTE — Telephone Encounter (Signed)
 09/14/2023  Name: [facility] MRN: [id number] DOB: 04-May-2000  Reason for Call:  Transition of Care Hospital Discharge Call  Contact Status: Patient Contact Status: Complete  Language assistant needed: Interpreter Mode: Interpreter Not Needed        Follow-Up Questions: Do You Have Any Concerns About Your Health As You Heal From Delivery?: No Do You Have Any Concerns About Your Infants Health?: No  Edinburgh Postnatal Depression Scale:  In the Past 7 Days: I have been able to laugh and see the funny side of things.: As much as I always could I have looked forward with enjoyment to things.: As much as I ever did I have blamed myself unnecessarily when things went wrong.: No, never I have been anxious or worried for no good reason.: No, not at all I have felt scared or panicky for no good reason.: No, not at all Things have been getting on top of me.: No, I have been coping as well as ever I have been so unhappy that I have had difficulty sleeping.: Not at all I have felt sad or miserable.: No, not at all I have been so unhappy that I have been crying.: No, never The thought of harming myself has occurred to me.: Never [place] Postnatal Depression Scale Total: 0  PHQ2-9 Depression Scale:     Discharge Follow-up: Edinburgh score requires follow up?: No Patient was advised of the following resources:: Support Group, Breastfeeding Support Group  Post-discharge interventions: Reviewed Newborn Safe Sleep Practices  Steva Tammy PEAK  09/14/2023 [phone]

## 2023-09-18 IMAGING — CT CT ABD-PELV W/ CM
2 of 4 series · 16 of 46 positions shown, 18 images · IV contrast (Omnipaque or Isovue)
Comparison: None.

CLINICAL DATA: Right lower quadrant pain. Rectal pressure when
sitting down

EXAM:
CT ABDOMEN AND PELVIS WITH CONTRAST
TECHNIQUE: Multidetector CT imaging of the abdomen and pelvis was performed
using the standard protocol following bolus administration of
intravenous contrast.

[Series 2: axial st · axial · 0.87mm/px · z∈[+886,+1376]mm · 13 of 108 slices shown, 15 images]
[im 5/108  soft-tissue]
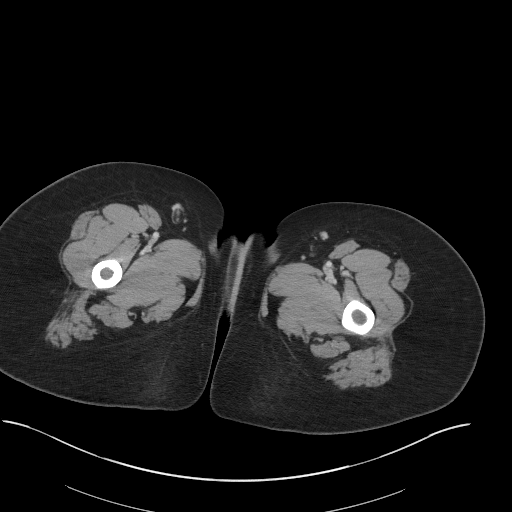
[im 5/108  bone]
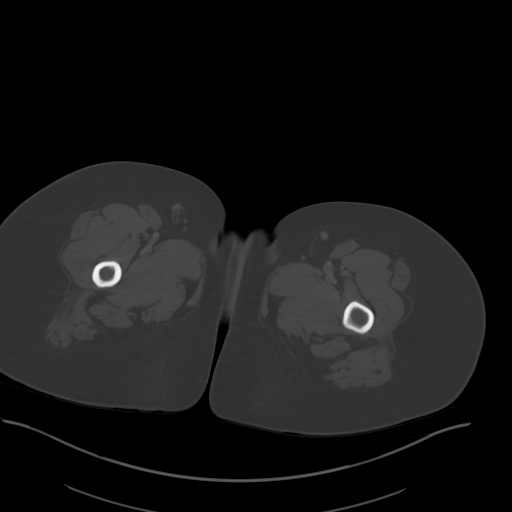
[im 14/108  soft-tissue]
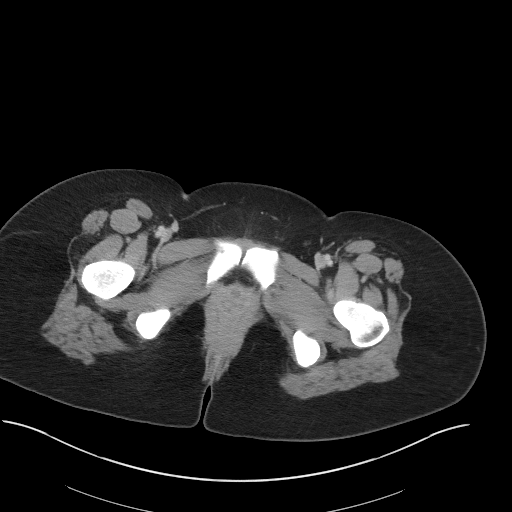
[im 23/108  soft-tissue]
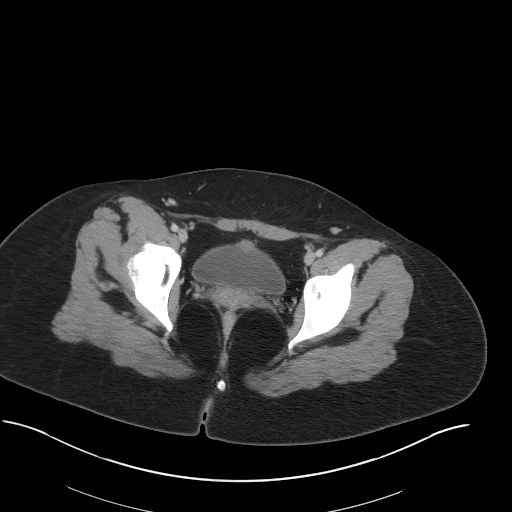
[im 32/108  soft-tissue]
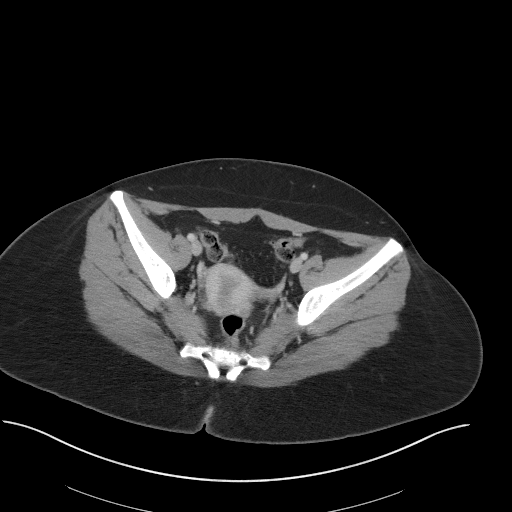
[im 36/108  soft-tissue]
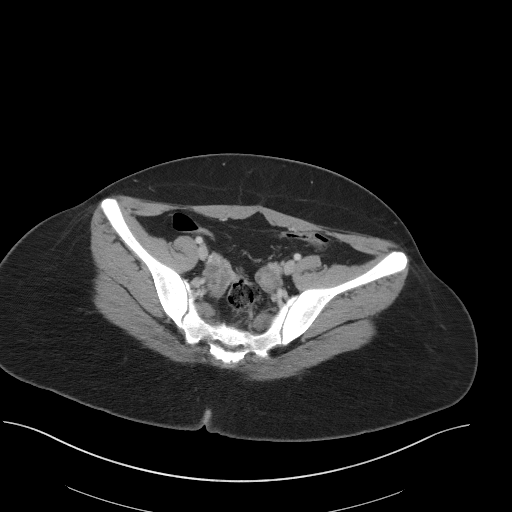
[im 45/108  soft-tissue]
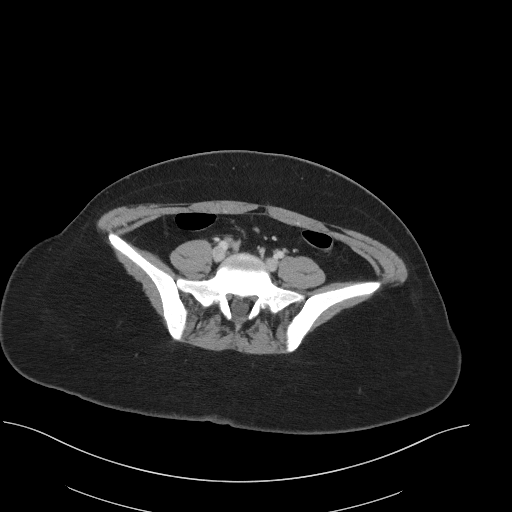
[im 54/108  soft-tissue]
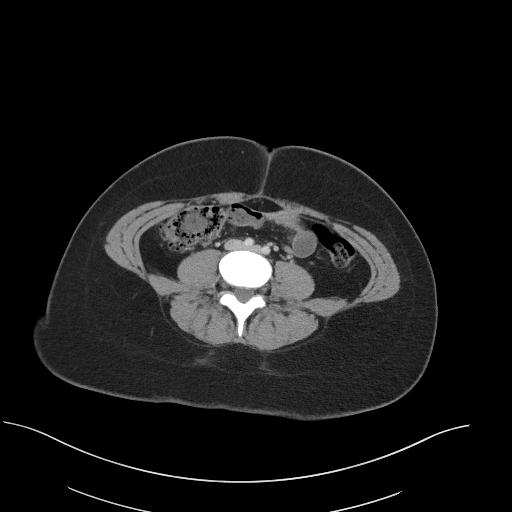
[im 63/108  soft-tissue]
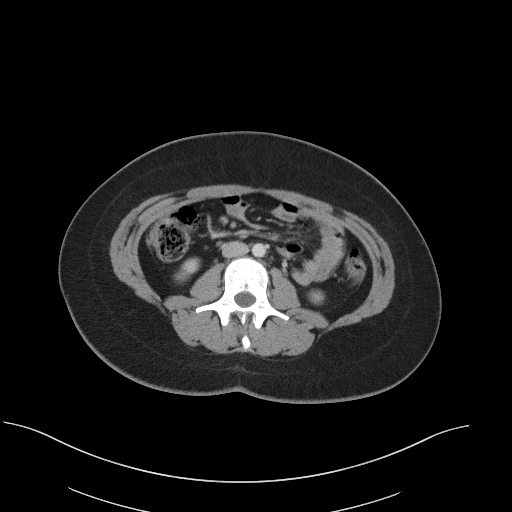
[im 72/108  soft-tissue]
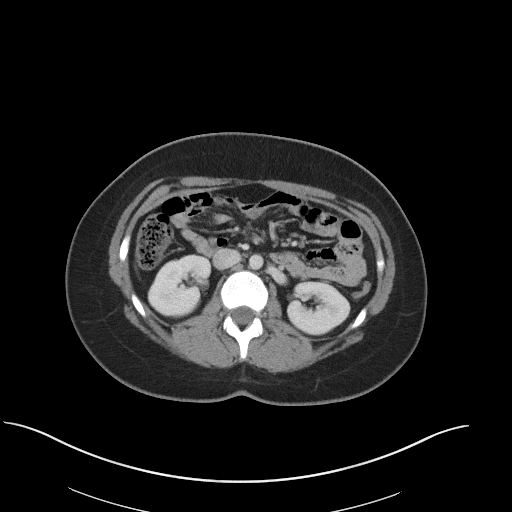
[im 72/108  bone]
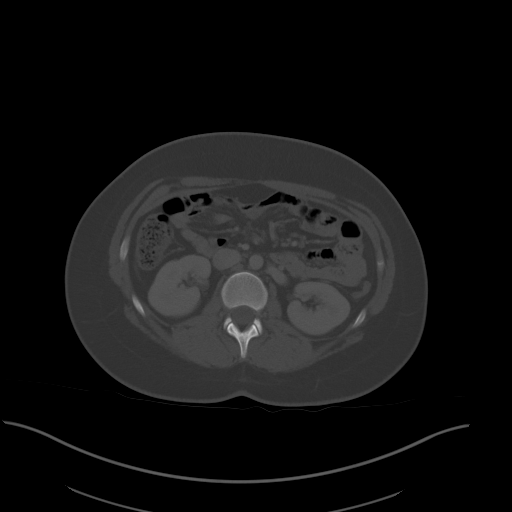
[im 76/108  soft-tissue]
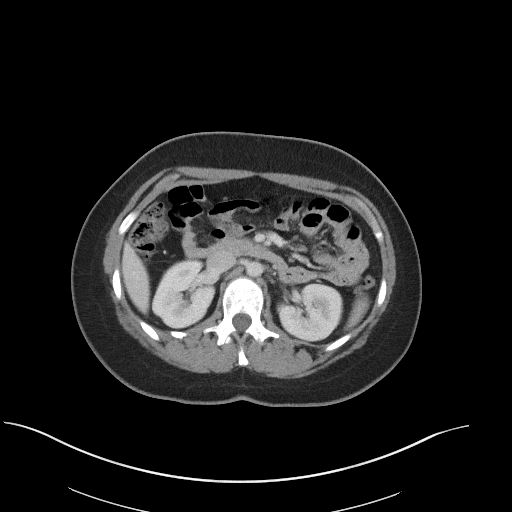
[im 85/108  soft-tissue]
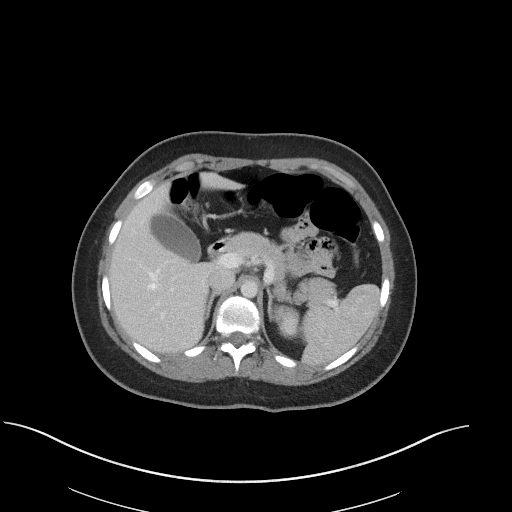
[im 94/108  soft-tissue]
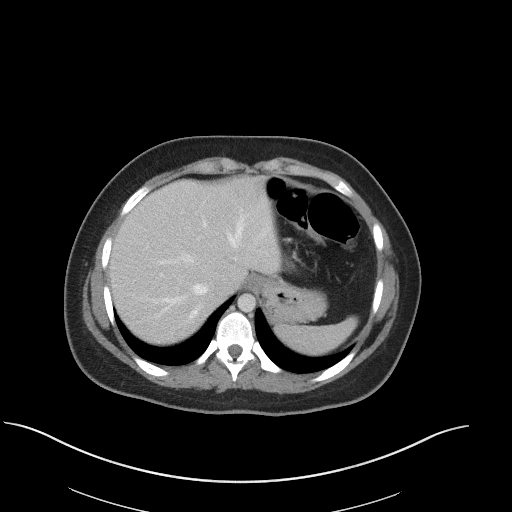
[im 103/108  soft-tissue]
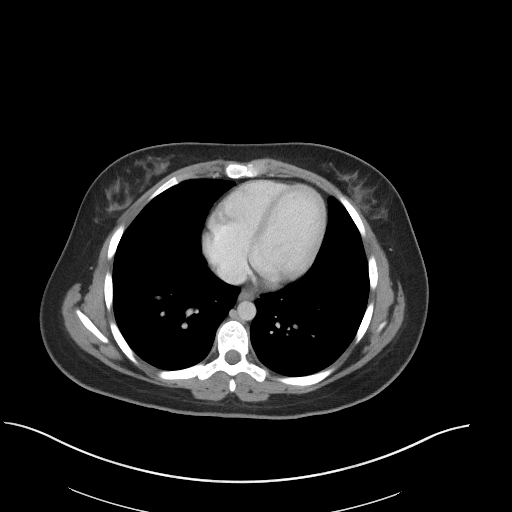

[Series 5: coronal st · coronal · 0.81mm/px · 3 of 96 slices shown]
[im 32/96  soft-tissue]
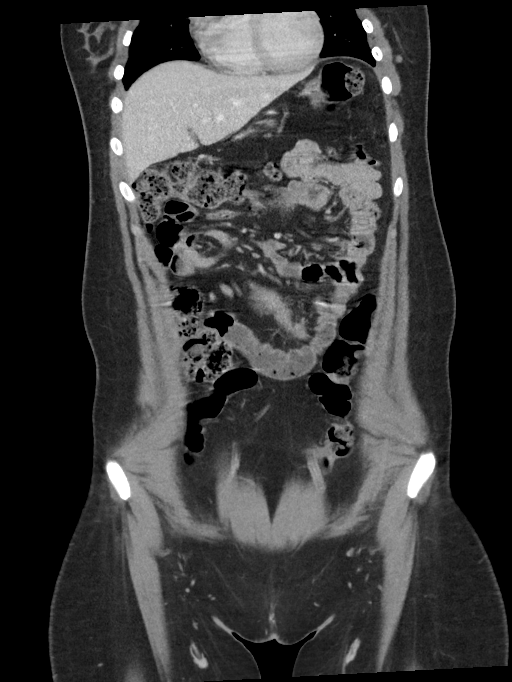
[im 43/96  soft-tissue]
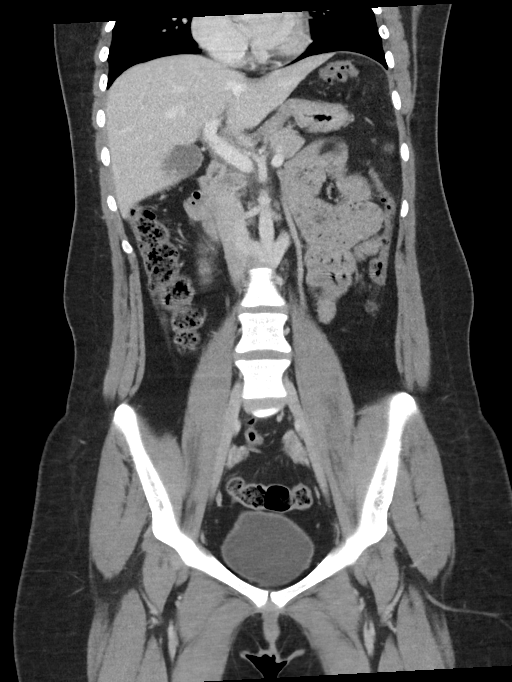
[im 53/96  soft-tissue]
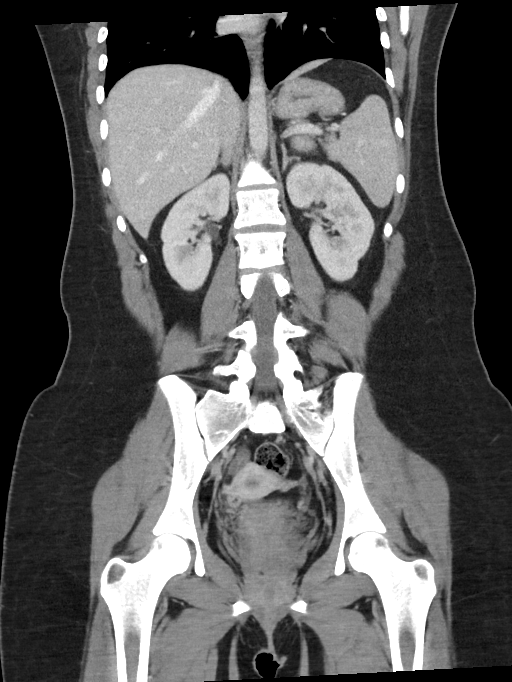

[16 of 46 positions shown; findings below may reference images not displayed]

RADIATION DOSE REDUCTION: This exam was performed according to the
departmental dose-optimization program which includes automated
exposure control, adjustment of the mA and/or kV according to
patient size and/or use of iterative reconstruction technique.

CONTRAST:  100mL OMNIPAQUE IOHEXOL 300 MG/ML  SOLN
FINDINGS: Lower chest:  No contributory findings.

Hepatobiliary: Tiny presumed cystic density in the right liver.No
evidence of biliary obstruction or stone.

Pancreas: Unremarkable.

Spleen: Unremarkable.

Adrenals/Urinary Tract: Negative adrenals. No hydronephrosis or
stone. Unremarkable bladder.

Stomach/Bowel: No obstruction. No appendicitis. Lax ileocolic
mesentery with appendix extending across the midline into the left
abdomen.

Vascular/Lymphatic: No acute vascular abnormality. No mass or
adenopathy.

Reproductive:No pathologic findings.

Other: No ascites or pneumoperitoneum.

Musculoskeletal: No acute abnormalities.
IMPRESSION: Negative abdominal CT.  No explanation for symptoms.

## 2023-09-28 ENCOUNTER — Ambulatory Visit: Payer: Self-pay

## 2023-09-28 ENCOUNTER — Ambulatory Visit: Admitting: Cardiology

## 2023-09-29 ENCOUNTER — Ambulatory Visit
Admission: RE | Admit: 2023-09-29 | Discharge: 2023-09-29 | Disposition: A | Discharge status: Home / Self Care | Payer: Self-pay | Attending: Family Medicine | Admitting: Family Medicine

## 2023-09-29 ENCOUNTER — Other Ambulatory Visit: Payer: Self-pay

## 2023-09-29 VITALS — BP 133/80 | HR 125 | Temp 98.7°F | Resp 20

## 2023-09-29 DIAGNOSIS — R0981 Nasal congestion: Secondary | ICD-10-CM

## 2023-09-29 LAB — POC COVID19/FLU A&B COMBO
Covid Antigen, POC: NEGATIVE
Influenza A Antigen, POC: NEGATIVE
Influenza B Antigen, POC: NEGATIVE

## 2023-09-29 NOTE — ED Triage Notes (Addendum)
 Pt reports nasal congestion and cough since Sunday. Denies any known fevers.

## 2023-09-29 NOTE — ED Provider Notes (Signed)
 [facility]   [id number] 09/29/23 Arrival Time: 1157  ASSESSMENT & PLAN:  1. Nasal congestion    Discussed typical duration of likely viral illness. Results for orders placed or performed during the hospital encounter of 09/29/23  POC Covid19/Flu A&B Antigen   Collection Time: 09/29/23 12:37 PM  Result Value Ref Range   Influenza A Antigen, POC Negative Negative   Influenza B Antigen, POC Negative Negative   Covid Antigen, POC Negative Negative   OTC symptom care as needed.    Follow-up Information     Luking, Glendia LABOR, MD.   Specialty: Family Medicine Why: As needed. Contact information: [address] [place] [address] [phone]                 Reviewed expectations re: course of current medical issues. Questions answered. Outlined signs and symptoms indicating need for more acute intervention. Understanding verbalized. After Visit Summary given.   SUBJECTIVE: History from: Patient. [facility] [place] is a 23 y.o. female. Pt reports nasal congestion and cough since Sunday. Denies any known fevers. Denies: fever. Normal PO intake without n/v/d.  OBJECTIVE:  Vitals:   09/29/23 1210  BP: 133/80  Pulse: (!) 125  Resp: 20  Temp: 98.7 F (37.1 C)  TempSrc: Oral  SpO2: 97%    General appearance: alert; no distress Eyes: PERRLA; EOMI; conjunctiva normal HENT: [place]; AT; with nasal congestion Neck: supple  Lungs: speaks full sentences without difficulty; unlabored Extremities: no edema Skin: warm and dry Neurologic: normal gait Psychological: alert and cooperative; normal mood and affect  Labs: Results for orders placed or performed during the hospital encounter of 09/29/23  POC Covid19/Flu A&B Antigen   Collection Time: 09/29/23 12:37 PM  Result Value Ref Range   Influenza A Antigen, POC Negative Negative   Influenza B Antigen, POC Negative Negative   Covid Antigen, POC Negative Negative   Labs Reviewed  POC  COVID19/FLU A&B COMBO    Imaging: No results found.  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   Chlamydia 02/25/2020   Treated 02/24/20 at Urgent Care with doxy, POC___________   Depression    IBS (irritable bowel syndrome)    Panic disorder with agoraphobia    Personality disorder (HCC)    PTSD (post-traumatic stress disorder)    Tic disorder    Social History   Socioeconomic History   Marital status: Significant Other    Spouse name: Not on file   Number of children: Not on file   Years of education: Not on file   Highest education level: 12th grade  Occupational History   Not on file  Tobacco Use   Smoking status: Never   Smokeless tobacco: Never  Vaping Use   Vaping status: Never Used  Substance and Sexual Activity   Alcohol use: No   Drug use: No   Sexual activity: Yes    Birth control/protection: None  Other Topics Concern   Not on file  Social History Narrative   Haven attends 6 th grade at [employer].  She is doing average this school year. She enjoys school.   Haven's parents are divorced. She has little contact with her father. She has adult-aged siblings that do not live in the home.    Social Drivers of Corporate investment banker Strain: Low Risk  (03/30/2023)   Overall Financial Resource Strain (CARDIA)    Difficulty of Paying Living Expenses: Not hard at all  Recent Concern: Financial Resource Strain - Medium Risk (03/02/2023)   Overall Financial Resource Strain (CARDIA)    Difficulty of Paying Living Expenses: Somewhat hard  Food Insecurity: No Food  Insecurity (08/31/2023)   Hunger Vital Sign    Worried About Running Out of Food in the Last Year: Never true    Ran Out of Food in the Last Year: Never true  Transportation Needs: No Transportation Needs (08/31/2023)   PRAPARE - Administrator, Civil Service (Medical): No    Lack of Transportation (Non-Medical): No  Physical Activity: Insufficiently Active (03/30/2023)   Exercise Vital Sign    Days of Exercise per Week: 2 days    Minutes of Exercise per Session: 20 min  Stress: Stress Concern Present (03/30/2023)   [employer] of Occupational Health - Occupational Stress Questionnaire    Feeling of Stress : Rather much  Social Connections: Unknown (03/30/2023)   Social Connection and Isolation Panel    Frequency of Communication with Friends and Family: More than three times a week    Frequency of Social Gatherings with Friends and Family: Once a week    Attends Religious Services: Never    Database administrator or Organizations: No    Attends Banker Meetings: Never    Marital Status: Patient declined  Recent Concern: Social Connections - Socially Isolated (03/02/2023)   Social Connection and Isolation Panel    Frequency of Communication with Friends and Family: Twice a week    Frequency of Social Gatherings with Friends and Family: Once a week    Attends Religious Services: Never    Database administrator or Organizations: No    Attends Banker Meetings: Never    Marital Status: Never married  Intimate Partner Violence: Not At Risk (03/03/2023)   Humiliation, Afraid, Rape, and Kick questionnaire    Fear of Current or Ex-Partner: No    Emotionally Abused: No    Physically Abused: No    Sexually Abused: No   Family History  Problem Relation Age of Onset   Depression Mother    Colon polyps Mother    Tourette syndrome Sister    Colon polyps Maternal Aunt    Alcohol abuse Maternal Uncle    Depression Maternal Grandmother    Colon polyps  Maternal Grandmother    Lung cancer Paternal Grandmother    Tics Cousin    Bipolar disorder Other    Colon cancer Neg Hx    Esophageal cancer Neg Hx    Rectal cancer Neg Hx    Stomach cancer Neg Hx    Past Surgical History:  Procedure Laterality Date   CHOLECYSTECTOMY     COLONOSCOPY  03/2023   TYMPANOSTOMY TUBE PLACEMENT     Wisdom Teeth Removal       Rolinda Rogue, MD 09/29/23 1430

## 2023-09-29 NOTE — Discharge Instructions (Signed)
 Results for orders placed or performed during the hospital encounter of 09/29/23  POC Covid19/Flu A&B Antigen   Collection Time: 09/29/23 12:37 PM  Result Value Ref Range   Influenza A Antigen, POC Negative Negative   Influenza B Antigen, POC Negative Negative   Covid Antigen, POC Negative Negative

## 2023-09-30 DIAGNOSIS — Z419 Encounter for procedure for purposes other than remedying health state, unspecified: Secondary | ICD-10-CM | POA: Diagnosis not present

## 2023-10-13 ENCOUNTER — Ambulatory Visit: Admitting: Advanced Practice Midwife

## 2023-10-13 ENCOUNTER — Encounter: Payer: Self-pay | Admitting: Advanced Practice Midwife

## 2023-10-13 DIAGNOSIS — Z1332 Encounter for screening for maternal depression: Secondary | ICD-10-CM | POA: Diagnosis not present

## 2023-10-13 DIAGNOSIS — O9081 Anemia of the puerperium: Secondary | ICD-10-CM

## 2023-10-13 LAB — POCT HEMOGLOBIN: Hemoglobin: 12.3 g/dL (ref 11–14.6)

## 2023-10-13 NOTE — Patient Instructions (Addendum)
 [facility] Lactation Support Group  Please join us  for our [facility] [employer] Lactation Support Group at [employer] [facility] We meet every Tuesday at 10:00 am to 12:00 pm at [employer] on the second floor in the conference room Lactating parents and lap babies are welcome, no registration is required, if you have a lactation pillow please bring      [facility] offers online women's holistic mental health counseling and therapy provided by licensed mental health counselors and therapists.   You can refer yourself using the link below: (if it isn't clickable from your mychart account, copy and paste it in a new browser).  If you have ANY problems, please let me know and I will help troubleshoot.   [email or web address]  OR  [email or web address]

## 2023-10-13 NOTE — Progress Notes (Addendum)
 Post Partum Visit Note  Chief Complaint:   Postpartum Care  History of Present Illness:   [facility] Toni Moss is a 23 y.o. G32P1001 female being seen today for a postpartum visit. She is 5 weeks 6 days postpartum following a spontaneous vaginal delivery at [redacted]w[redacted]d. IOL: Yes, for PROM. Anesthesia: epidural.  Laceration: 2nd degree.  Complications: none. Inpatient contraception: yes Nexplanon .   Pregnancy uncomplicated. Tobacco use: no. Substance use disorder: no. Next pap smear due: first Pap due Aug 11, 2002 Patient's last menstrual period was 09/04/2023 (exact date).  Postpartum course has been uncomplicated. Bleeding; PP bleeding resolved at 3 weeks, began bleeding like a menstrual period 2 weeks ago. Bowel function is normal. Bladder function is normal. Urinary incontinence? No, fecal incontinence? No Patient is not sexually active. Last sexual activity: prior to birth.    Upstream - 10/13/23 1051       Pregnancy Intention Screening   Does the patient want to become pregnant in the next year? No    Does the patient's partner want to become pregnant in the next year? No    Would the patient like to discuss contraceptive options today? No      Contraception Wrap Up   Current Method Hormonal Implant    End Method Hormonal Implant    Contraception Counseling Provided No         The pregnancy intention screening data noted above was reviewed. Potential methods of contraception were discussed. The patient elected to proceed with Hormonal Implant.  Edinburgh Postpartum Depression Screening: Negative  Edinburgh Postnatal Depression Scale - 10/13/23 1047       Edinburgh Postnatal Depression Scale:  In the Past 7 Days   I have been able to laugh and see the funny side of things. 0    I have looked forward with enjoyment to things. 0    I have blamed myself unnecessarily when things went wrong. 0    I have been anxious or worried for no good reason. 0    I have felt scared or panicky for no  good reason. 0    Things have been getting on top of me. 1    I have been so unhappy that I have had difficulty sleeping. 0    I have felt sad or miserable. 0    I have been so unhappy that I have been crying. 0    The thought of harming myself has occurred to me. 0    Edinburgh Postnatal Depression Scale Total 1         Baby's course has been uncomplicated. Baby is feeding by breast: milk supply adequate. Infant has a pediatrician/family doctor? Yes.  Childcare strategy if returning to work/school: planning to be home with baby.  Pt has material needs met for her and baby: Yes.   Review of Systems:   Pertinent items are noted in HPI Denies Abnormal vaginal discharge w/ itching/odor/irritation, headaches, visual changes, shortness of breath, chest pain, abdominal pain, severe nausea/vomiting, or problems with urination or bowel movements. Pertinent History Reviewed:  Reviewed past medical,surgical, obstetrical and family history.  Reviewed problem list, medications and allergies. OB History  Gravida Para Term Preterm AB Living  1 1 1  0 0 1  SAB IAB Ectopic Multiple Live Births  0 0 0 0 1    # Outcome Date GA Lbr Len/2nd Weight Sex Type Anes PTL Lv  1 Term 09/02/23 [redacted]w[redacted]d 36:57 / 00:55 2930 g F Vag-Spont EPI  LIV  Birth Comments: WNL   Physical Assessment:   Vitals:   10/13/23 1042  BP: 115/79  Pulse: (!) 111  Weight: 82.6 kg  Height: 5' 5 (1.651 m)  Body mass index is 30.29 kg/m.  Objective:  Blood pressure 115/79, pulse (!) 111, height 5' 5 (1.651 m), weight 82.6 kg, last menstrual period 09/04/2023, currently breastfeeding.  General:  alert, cooperative, and no distress   Breasts:  negative  Lungs: Normal respiratory rate and effort  Heart:  regular rate  Abdomen: soft  Pelvic: Normal external genitalia, Perineum well healed, no remaining sutures        Results for orders placed or performed in visit on 10/13/23 (from the past 24 hours)  POCT hemoglobin    Collection Time: 10/13/23 11:31 AM  Result Value Ref Range   Hemoglobin 12.3 11 - 14.6 g/dL    Assessment & Plan:  1) Postpartum exam normal, 5w s/p NSVD, concerned about Hgb, POC Hgb 12.3 2) breast feeding going well; information given about lactation services 3) Depression screening negative, does have history of PTSD, depression, anxiety, and borderline personality disorder; d/c meds during pregnancy; encouraged patient to use [facility] as a resource if she desires mental health services 4) Contraception: Nexplanon ; history of breakthrough bleeding on Nexplanon , will contact office if she experiences ongoing bleeding  Essential components of care per ACOG recommendations:  1.  Mood and well being:  If positive depression screen, discussed and plan developed.  If using tobacco we discussed reduction/cessation and risk of relapse If current substance abuse, we discussed and referral to local resources was offered.   2. Infant care and feeding:  If breastfeeding, discussed returning to work, pumping, breastfeeding-associated pain, guidance regarding return to fertility while lactating if not using another method. If needed, patient was provided with a letter to be allowed to pump q 2-3hrs to support lactation in a private location with access to a refrigerator to store breastmilk.   Recommended that all caregivers be immunized for flu, pertussis and other preventable communicable diseases If pt does not have material needs met for her/baby, referred to local resources for help obtaining these.  3. Sexuality, contraception and birth spacing Provided guidance regarding sexuality, management of dyspareunia, and resumption of intercourse Discussed avoiding interpregnancy interval <3mths and recommended birth spacing of 18 months  4. Sleep and fatigue Discussed coping options for fatigue and sleep disruption Encouraged family/partner/community support of 4 hrs of uninterrupted sleep to help with  mood and fatigue  5. Physical recovery  If pt had a C/S, assessed incisional pain and providing guidance on normal vs prolonged recovery If pt had a laceration, perineal healing and pain reviewed.  If urinary or fecal incontinence, discussed management and referred to PT or uro/gyn if indicated  Patient is safe to resume physical activity. Discussed attainment of healthy weight.  6.  Chronic disease management Discussed pregnancy complications if any, and their implications for future childbearing and long-term maternal health. Review recommendations for prevention of recurrent pregnancy complications, such as aspirin to reduce risk of preeclampsia not applicable. Pt had GDM: No. If yes, 2hr GTT scheduled: not applicable. Reviewed medications and non-pregnant dosing including consideration of whether pt is breastfeeding using a reliable resource such as L[employer]: no Referred for f/u w/ PCP or subspecialist providers as indicated: previously established with PCP  7. Health maintenance Mammogram at 21yo or earlier if indicated Pap smears as indicated  Meds: No orders of the defined types were placed in this encounter.  Follow-up:  No follow-ups on file.   Orders Placed This Encounter  Procedures   POCT hemoglobin   Vernell Ruddle, SNM 10/13/2023 1:01 PM    I personally saw and evaluated the patient, performing the key elements of the service. I developed and verified the management plan that is described in the resident's/student's note, and I agree with the content with my edits above. VSS, HRR&R, Resp unlabored, Legs neg.  Sherrell Ely, CNM 10/13/2023 2:25 PM

## 2023-10-17 ENCOUNTER — Other Ambulatory Visit: Payer: Self-pay | Admitting: Advanced Practice Midwife

## 2023-10-17 ENCOUNTER — Encounter: Payer: Self-pay | Admitting: Advanced Practice Midwife

## 2023-10-17 DIAGNOSIS — K921 Melena: Secondary | ICD-10-CM

## 2023-10-17 NOTE — Progress Notes (Signed)
 Message from pt that she has been seeing a significant amoubt of blood n her stool, even clots, since giving birth.  Referred to GI

## 2023-10-18 ENCOUNTER — Encounter (INDEPENDENT_AMBULATORY_CARE_PROVIDER_SITE_OTHER): Payer: Self-pay | Admitting: *Deleted

## 2023-10-18 ENCOUNTER — Encounter (INDEPENDENT_AMBULATORY_CARE_PROVIDER_SITE_OTHER): Payer: Self-pay | Admitting: Gastroenterology

## 2023-10-18 ENCOUNTER — Ambulatory Visit (INDEPENDENT_AMBULATORY_CARE_PROVIDER_SITE_OTHER): Admitting: Gastroenterology

## 2023-10-18 VITALS — BP 117/85 | HR 99 | Temp 97.8°F | Ht 65.0 in | Wt 185.9 lb

## 2023-10-18 DIAGNOSIS — K921 Melena: Secondary | ICD-10-CM | POA: Insufficient documentation

## 2023-10-18 DIAGNOSIS — K641 Second degree hemorrhoids: Secondary | ICD-10-CM | POA: Insufficient documentation

## 2023-10-18 DIAGNOSIS — R197 Diarrhea, unspecified: Secondary | ICD-10-CM | POA: Insufficient documentation

## 2023-10-18 DIAGNOSIS — K529 Noninfective gastroenteritis and colitis, unspecified: Secondary | ICD-10-CM

## 2023-10-18 MED ORDER — HYDROCORTISONE ACETATE 25 MG RE SUPP: 25.0000 mg | Freq: Two times a day (BID) | RECTAL | 0 refills | Status: AC

## 2023-10-18 MED ORDER — PHENYLEPHRINE-MINERAL OIL-PET 0.25-14-74.9 % RE OINT: 1.0000 | TOPICAL_OINTMENT | Freq: Two times a day (BID) | RECTAL | 0 refills | Status: AC

## 2023-10-18 MED ORDER — PSYLLIUM 58.6 % PO PACK: 1.0000 | PACK | Freq: Two times a day (BID) | ORAL | 2 refills | Status: AC

## 2023-10-18 NOTE — Progress Notes (Signed)
 Em[facility] Leaver Moss Nyjae Hodge , M.D. Gastroenterology & Hepatology [facility] Gastroenterology [address] [place], [place] [address] Primary Care Physician: Alphonsa Glendia LABOR, MD [address] [place] [address]  Chief Complaint: Hematochezia  History of Present Illness:  Toni Moss is a 23 y.o. female with history of IBS-D who presents for evaluation of Hematochezia  Patient was last seen by Dr. Aneita for hematochezia and diarrhea underwent upper endoscopy and colonoscopy in 2022 which were essentially negative.    Patient reports that she gave birth 6 weeks ago and since then she is having moderate amount of blood in the stool.  Patient reports she had is always noticed fresh blood upon wiping but since given vaginal delivery this has exacerbated.  She has not had bowel movements for past 2 days and usually has liquid stools. The patient denies having any nausea, vomiting, fever, chills,  melena, hematemesis, abdominal distention, abdominal pain,  jaundice, pruritus or weight loss.  Last [id number] - The esophagus was normal. - The stomach was normal. - The examined duodenum was normal. Biopsies for histology were taken with a cold forceps for evaluation of celiac disease. - The cardia and gastric fundus were normal on retroflexion.  Last Colonoscopy:2022- diarrhea and hematochezia  - The perianal and digital rectal examinations were normal. - The terminal ileum appeared normal. - The entire examined colon appeared normal on direct and retroflexion views. Biopsies for histology were taken with a cold forceps from the entire colon for evaluation of microscopic colitis.  Surgical [P], colon, random sites - COLONIC MUCOSA WITH NO SPECIFIC HISTOPATHOLOGIC CHANGES - NEGATIVE FOR ACUTE INFLAMMATION, INCREASED INTRAEPITHELIAL LYMPHOCYTES OR THICKENED SUBEPITHELIAL COLLAGEN TABLE 2. 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   Chlamydia 02/25/2020   Treated 02/24/20 at Urgent Care with doxy, POC___________   Depression    IBS (irritable bowel syndrome)    Panic disorder with agoraphobia    Personality disorder (HCC)    PTSD (post-traumatic stress disorder)    Tic disorder     Past Surgical History: Past Surgical History:  Procedure Laterality Date   CHOLECYSTECTOMY     COLONOSCOPY  03/2023   TYMPANOSTOMY TUBE PLACEMENT     Wisdom Teeth Removal      Family History: Family History  Problem Relation Age of Onset   Depression Mother    Colon polyps Mother    Tourette syndrome Sister    Colon polyps Maternal Aunt    Alcohol abuse Maternal Uncle    Depression Maternal Grandmother    Colon polyps Maternal Grandmother    Lung cancer Paternal Grandmother    Tics Cousin    Bipolar disorder Other    Colon cancer Neg Hx    Esophageal cancer Neg Hx    Rectal cancer Neg Hx    Stomach cancer Neg Hx     Social History: Social History   Tobacco Use  Smoking Status Never  Smokeless Tobacco Never   Social History   Substance and Sexual Activity  Alcohol Use No   Social History   Substance and Sexual Activity  Drug Use No    Allergies: Allergies  Allergen Reactions   Prozac  [  Fluoxetine  Hcl] Anaphylaxis and Swelling    Throat swelling    Doxycycline  Nausea And Vomiting   Penicillins Hives   Phenergan  [Promethazine ]     Makes Pt feels like she's going to pass out     Medications: Current Outpatient Medications  Medication Sig Dispense Refill   hydrocortisone (ANUSOL-HC) 25 MG suppository Place 1 suppository (25 mg total) rectally 2 (two) times daily for 10 days. 20 suppository 0    phenylephrine -shark liver oil-mineral oil-petrolatum (PREPARATION H) 0.25-14-74.9 % rectal ointment Place 1 Application rectally in the morning and at bedtime. 28 g 0   Prenatal Vit-Fe Fumarate-FA (PRENATAL VITAMIN PO) Take 1 tablet by mouth daily.     psyllium (METAMUCIL) 58.6 % packet Take 1 packet by mouth 2 (two) times daily. 60 packet 2   No current facility-administered medications for this visit.    Review of Systems: GENERAL: negative for malaise, night sweats HEENT: No changes in hearing or vision, no nose bleeds or other nasal problems. NECK: Negative for lumps, goiter, pain and significant neck swelling RESPIRATORY: Negative for cough, wheezing CARDIOVASCULAR: Negative for chest pain, leg swelling, palpitations, orthopnea GI: SEE HPI MUSCULOSKELETAL: Negative for joint pain or swelling, back pain, and muscle pain. SKIN: Negative for lesions, rash HEMATOLOGY Negative for prolonged bleeding, bruising easily, and swollen nodes. ENDOCRINE: Negative for cold or heat intolerance, polyuria, polydipsia and goiter. NEURO: negative for tremor, gait imbalance, syncope and seizures. The remainder of the review of systems is noncontributory.   Physical Exam: BP 117/85 (BP Location: Left Arm, Patient Position: Sitting, Cuff Size: Large)   Pulse 99   Temp 97.8 F (36.6 C) (Temporal)   Ht 5' 5 (1.651 m)   Wt 185 lb 14.4 oz (84.3 kg)   LMP 09/04/2023 (Exact Date) Comment: still on  Breastfeeding Yes   BMI 30.94 kg/m  GENERAL: The patient is AO x3, in no acute distress. HEENT: Head is normocephalic and atraumatic. EOMI are intact. Mouth is well hydrated and without lesions. NECK: Supple. No masses LUNGS: Clear to auscultation. No presence of rhonchi/wheezing/rales. Adequate chest expansion HEART: RRR, normal s1 and s2. ABDOMEN: Soft, nontender, no guarding, no peritoneal signs, and nondistended. BS +. No masses. RECTAL EXAM: Patient refused at this time    Imaging/Labs: as  above     Latest Ref Rng & Units 10/13/2023   11:31 AM 09/01/2023    9:32 PM 08/31/2023    7:31 PM  CBC  WBC 4.0 - 10.5 K/uL  19.7  20.0   Hemoglobin 11 - 14.6 g/dL 87.6  9.8  88.3   Hematocrit 36.0 - 46.0 %  30.5  35.4   Platelets 150 - 400 K/uL  244  316    No results found for: IRON, TIBC, FERRITIN  I personally reviewed and interpreted the available labs, imaging and endoscopic files.  MRI Abdomen  IMPRESSION: 1. No acute findings are noted in the abdomen. Specifically, no evidence of choledocholithiasis or biliary tract obstruction.   IMPRESSION: Findings are equivocal for acute cholecystitis. Extensive sludge and small stones within the gallbladder with positive Murphy's sign noted by the sonographer. No pericholecystic fluid or gallbladder wall thickening.  Impression and Plan:  Toni Moss is a 23 y.o. female with history of IBS-D who presents for evaluation of Hematochezia  #Painless Hematochezia  Patient reports blood per rectum since vaginal delivery 6 weeks ago.  History is difficult for benign anorectal lesion such as hemorrhoidal   I offered patient for digital rectal exam to  be performed in the clinic today the patient refused at this time and would like to be treated medically first  Patient had last colonoscopy with Dr. Aneita in 2022 for diarrhea hematochezia which was essentially negative  Continue medical management of possible hemorrhoids  Ensure adequate fluid intake: Aim for 8 glasses of water  daily. Follow a high fiber diet: Include foods such as dates, prunes, pears, and kiwi. Anusol suppository Preparation H Metamucil twice daily  Given chronic diarrhea and intermittent blood per rectum will send lab work for alpha gal, celiac panel, and screening for IBD with fecal calprotectin  Will reassess patient's symptoms in future; if persistent symptoms may discuss repeat colonoscopy   All questions were answered.      Toni Moss  Toni Desrosier, MD Gastroenterology and Hepatology [facility] Gastroenterology   This chart has been completed using [facility] Dictation software, and while attempts have been made to ensure accuracy , certain words and phrases may not be transcribed as intended

## 2023-10-18 NOTE — Patient Instructions (Signed)
 It was very nice to meet you today, as dicussed with will plan for the following :  1) Ensure adequate fluid intake: Aim for 8 glasses of water  daily. Follow a high fiber diet: Include foods such as dates, prunes, pears, and kiwi. Use Metamucil twice a day. Anusol suppository twice daily-10days Preparation H for 7 days  2) labwork and stool sample

## 2023-11-01 ENCOUNTER — Encounter: Payer: Self-pay | Admitting: *Deleted

## 2023-11-02 ENCOUNTER — Ambulatory Visit: Attending: Cardiovascular Disease | Admitting: Cardiology

## 2023-11-02 ENCOUNTER — Encounter: Payer: Self-pay | Admitting: Cardiology

## 2023-11-02 NOTE — Progress Notes (Signed)
 Cardio-Obstetrics Clinic  Follow Up Note   Date:  11/02/2023   ID:  [facility], [place] 26-Dec-2000, MRN [id number]  PCP:  Alphonsa Glendia LABOR, MD   [facility] Providers Cardiologist:  None  Electrophysiologist:  None        Referring MD: Alphonsa Glendia LABOR, MD   Chief Complaint:  I am ok  History of Present Illness:    Toni Moss is a 23 y.o. female [G1P1001] who returns for follow up - she is post partum.  She offers no complaints.   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   Chlamydia 02/25/2020   Treated 02/24/20 at Urgent Care with doxy, POC___________   Depression    IBS (irritable bowel syndrome)    Panic disorder with agoraphobia    Personality disorder (HCC)    PTSD (post-traumatic stress disorder)    Tic disorder     Past Surgical History:  Procedure Laterality Date   CHOLECYSTECTOMY     COLONOSCOPY  03/2023   TYMPANOSTOMY TUBE PLACEMENT     Wisdom Teeth Removal        OB History     Gravida  1   Para  1   Term  1   Preterm  0   AB  0   Living  1      SAB  0   IAB  0   Ectopic  0   Multiple  0   Live Births  1               Current Medications: Current Meds  Medication Sig   phenylephrine -shark liver oil-mineral oil-petrolatum (PREPARATION H) 0.25-14-74.9 % rectal ointment Place 1 Application rectally in the morning and at bedtime.   Prenatal Vit-Fe Fumarate-FA (PRENATAL VITAMIN PO) Take 1 tablet by mouth daily.   psyllium (METAMUCIL) 58.6 % packet Take 1 packet by mouth 2 (two) times daily.     Allergies:   Prozac  [fluoxetine  hcl], Doxycycline , Penicillins, and Phenergan  [promethazine ]   Social History   Socioeconomic History   Marital status: Significant Other    Spouse  name: Not on file   Number of children: Not on file   Years of education: Not on file   Highest education level: 12th grade  Occupational History   Not on file  Tobacco Use   Smoking status: Never   Smokeless tobacco: Never  Vaping Use   Vaping status: Never Used  Substance and Sexual Activity   Alcohol use: No   Drug use: No   Sexual activity: Not Currently    Birth control/protection: Implant  Other Topics Concern   Not on file  Social History Narrative   Haven attends 6 th grade at [employer]. She is doing average this school year. She enjoys school.   Haven's parents are divorced. She has little contact with her father. She has adult-aged siblings that do not live in the home.    Social Drivers of Health   Financial Resource Strain: Low Risk  (03/30/2023)   Overall Financial Resource Strain (CARDIA)    Difficulty of Paying Living Expenses: Not hard at all  Recent Concern: Financial Resource Strain - Medium Risk (03/02/2023)   Overall Financial Resource Strain (CARDIA)    Difficulty  of Paying Living Expenses: Somewhat hard  Food Insecurity: No Food Insecurity (08/31/2023)   Hunger Vital Sign    Worried About Running Out of Food in the Last Year: Never true    Ran Out of Food in the Last Year: Never true  Transportation Needs: No Transportation Needs (08/31/2023)   PRAPARE - Administrator, Civil Service (Medical): No    Lack of Transportation (Non-Medical): No  Physical Activity: Insufficiently Active (03/30/2023)   Exercise Vital Sign    Days of Exercise per Week: 2 days    Minutes of Exercise per Session: 20 min  Stress: Stress Concern Present (03/30/2023)   [employer] of Occupational Health - Occupational Stress Questionnaire    Feeling of Stress : Rather much  Social Connections: Unknown (03/30/2023)   Social Connection and Isolation Panel    Frequency of Communication with Friends and Family: More than three times a week    Frequency  of Social Gatherings with Friends and Family: Once a week    Attends Religious Services: Never    Database administrator or Organizations: No    Attends Engineer, structural: Never    Marital Status: Patient declined  Recent Concern: Social Connections - Socially Isolated (03/02/2023)   Social Connection and Isolation Panel    Frequency of Communication with Friends and Family: Twice a week    Frequency of Social Gatherings with Friends and Family: Once a week    Attends Religious Services: Never    Database administrator or Organizations: No    Attends Engineer, structural: Never    Marital Status: Never married      Family History  Problem Relation Age of Onset   Depression Mother    Colon polyps Mother    Tourette syndrome Sister    Colon polyps Maternal Aunt    Alcohol abuse Maternal Uncle    Depression Maternal Grandmother    Colon polyps Maternal Grandmother    Lung cancer Paternal Grandmother    Tics Cousin    Bipolar disorder Other    Colon cancer Neg Hx    Esophageal cancer Neg Hx    Rectal cancer Neg Hx    Stomach cancer Neg Hx       ROS:   Please see the history of present illness.     All other systems reviewed and are negative.   Labs/EKG Reviewed:    EKG:  None today   Recent Labs: 03/04/2023: Magnesium 1.4 04/06/2023: TSH 1.016 09/01/2023: ALT 8; BUN 6; Creatinine, Ser 0.65; Platelets 244; Potassium 2.9; Sodium 133 10/13/2023: Hemoglobin 12.3   Recent Lipid Panel Lab Results  Component Value Date/Time   CHOL 148 11/04/2022 10:03 AM   TRIG 57 11/04/2022 10:03 AM   HDL 52 11/04/2022 10:03 AM   CHOLHDL 2.8 11/04/2022 10:03 AM   LDLCALC 84 11/04/2022 10:03 AM    Physical Exam:    VS:  BP 116/80 (BP Location: Left Arm, Patient Position: Sitting, Cuff Size: Normal)   Pulse (!) 101   Ht 5' 5 (1.651 m)   Wt 188 lb 12.8 oz (85.6 kg)   LMP 09/04/2023 (Exact Date) Comment: still on  SpO2 99%   BMI 31.42 kg/m     Wt Readings from  Last 3 Encounters:  11/02/23 188 lb 12.8 oz (85.6 kg)  10/18/23 185 lb 14.4 oz (84.3 kg)  10/13/23 182 lb (82.6 kg)     GEN:  Well nourished, well developed in  no acute distress HEENT: Normal NECK: No JVD; No carotid bruits LYMPHATICS: No lymphadenopathy CARDIAC: RRR, no murmurs, rubs, gallops RESPIRATORY:  Clear to auscultation without rales, wheezing or rhonchi  ABDOMEN: Soft, non-tender, non-distended MUSCULOSKELETAL:  No edema; No deformity  SKIN: Warm and dry NEUROLOGIC:  Alert and oriented x 3 PSYCHIATRIC:  Normal affect    Risk Assessment/Risk Calculators:                 ASSESSMENT & PLAN:    Postpartum CV care - no complaints today. Doing well.  Will follow up as needed.    There are no Patient Instructions on file for this visit.   Dispo:  No follow-ups on file.   Medication Adjustments/Labs and Tests Ordered: Current medicines are reviewed at length with the patient today.  Concerns regarding medicines are outlined above.  Tests Ordered: No orders of the defined types were placed in this encounter.  Medication Changes: No orders of the defined types were placed in this encounter.

## 2023-11-02 NOTE — Patient Instructions (Addendum)
 Medication Instructions:  Your physician recommends that you continue on your current medications as directed. Please refer to the Current Medication list given to you today.  *If you need a refill on your cardiac medications before your next appointment, please call your pharmacy*   Follow-Up: At [facility], you and your health needs are our priority.  As part of our continuing mission to provide you with exceptional heart care, our providers are all part of one team.  This team includes your primary Cardiologist (physician) and Advanced Practice Providers or APPs (Physician Assistants and Nurse Practitioners) who all work together to provide you with the care you need, when you need it.  Your next appointment:    As needed  Provider:   Kardie Tobb, DO

## 2023-11-30 DIAGNOSIS — Z419 Encounter for procedure for purposes other than remedying health state, unspecified: Secondary | ICD-10-CM | POA: Diagnosis not present

## 2023-12-01 ENCOUNTER — Ambulatory Visit: Payer: Self-pay

## 2023-12-01 ENCOUNTER — Ambulatory Visit
Admission: RE | Admit: 2023-12-01 | Discharge: 2023-12-01 | Disposition: A | Discharge status: Home / Self Care | Source: Ambulatory Visit | Attending: Nurse Practitioner | Admitting: Nurse Practitioner

## 2023-12-01 VITALS — BP 117/81 | HR 105 | Temp 99.0°F | Resp 16

## 2023-12-01 DIAGNOSIS — J029 Acute pharyngitis, unspecified: Secondary | ICD-10-CM | POA: Insufficient documentation

## 2023-12-01 DIAGNOSIS — J069 Acute upper respiratory infection, unspecified: Secondary | ICD-10-CM | POA: Diagnosis not present

## 2023-12-01 LAB — POC COVID19/FLU A&B COMBO
Covid Antigen, POC: NEGATIVE
Influenza A Antigen, POC: NEGATIVE
Influenza B Antigen, POC: NEGATIVE

## 2023-12-01 LAB — POCT RAPID STREP A (OFFICE): Rapid Strep A Screen: NEGATIVE

## 2023-12-01 MED ORDER — FLUTICASONE PROPIONATE 50 MCG/ACT NA SUSP: 2.0000 | Freq: Every day | NASAL | 0 refills | Status: DC
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
## 2023-12-01 NOTE — Discharge Instructions (Addendum)
 The rapid strep test and COVID/flu test were negative.  A throat culture is pending.  You will be contacted if the pending test result is abnormal.  You will also have access to the results via MyChart. Take medication as prescribed. You may take over-the-counter Tylenol  as needed for pain, fever, or general discomfort. Recommend use of normal saline nasal spray throughout the day for nasal congestion or runny nose. For your cough, recommend the use of a humidifier in your bedroom at nighttime during sleep and sleeping elevated on pillows while cough symptoms persist. You may perform warm salt water  gargles 3-4 times daily as needed for throat pain or discomfort.  Also recommend the use of warm teas with honey and lemon to help with sore throat and your cough. Symptoms should begin to improve over the next 5 to 7 days.  If symptoms fail to improve, or begin to worsen, you may follow-up in this clinic or with your primary care physician for further evaluation. Follow-up as needed.

## 2023-12-01 NOTE — ED Triage Notes (Signed)
 Pt states fever,sore throat, cough,congestion,headache and dizziness for the past 4 days. States she took tylenol  cold and flu yesterday.

## 2023-12-04 LAB — CULTURE, GROUP A STREP (THRC)

## 2023-12-05 ENCOUNTER — Ambulatory Visit (HOSPITAL_COMMUNITY): Payer: Self-pay

## 2023-12-30 DIAGNOSIS — Z419 Encounter for procedure for purposes other than remedying health state, unspecified: Secondary | ICD-10-CM | POA: Diagnosis not present

## 2024-01-09 ENCOUNTER — Encounter (INDEPENDENT_AMBULATORY_CARE_PROVIDER_SITE_OTHER): Payer: Self-pay | Admitting: Gastroenterology

## 2024-01-09 ENCOUNTER — Ambulatory Visit (INDEPENDENT_AMBULATORY_CARE_PROVIDER_SITE_OTHER): Admitting: Gastroenterology

## 2024-01-09 VITALS — BP 112/78 | HR 128 | Temp 97.8°F | Ht 64.25 in | Wt 189.4 lb

## 2024-01-09 DIAGNOSIS — K625 Hemorrhage of anus and rectum: Secondary | ICD-10-CM | POA: Diagnosis not present

## 2024-01-09 DIAGNOSIS — K649 Unspecified hemorrhoids: Secondary | ICD-10-CM | POA: Diagnosis not present

## 2024-01-09 DIAGNOSIS — R197 Diarrhea, unspecified: Secondary | ICD-10-CM | POA: Diagnosis not present

## 2024-01-09 DIAGNOSIS — K921 Melena: Secondary | ICD-10-CM

## 2024-01-09 MED ORDER — HYDROCORT-PRAMOXINE (PERIANAL) 1-1 % EX FOAM: 1.0000 | Freq: Three times a day (TID) | CUTANEOUS | 1 refills | Status: AC

## 2024-01-09 NOTE — Progress Notes (Signed)
 "  Referring Provider: Alphonsa Glendia LABOR, MD Primary Care Physician:  Alphonsa Glendia LABOR, MD Primary GI Physician: Dr. Cinderella   Chief Complaint  Patient presents with   Follow-up    Patient here today for a follow up on Diarrhea. Patient is still having issues with this,and says since giving birth she now has issues with hemorrhoids, which itch, and are painful and has seen a lot of blood in the toilet on occasion. She is not using any medication for the diarrhea. She says she was given a suppository and using prep h, and this has not helped. She says she is not taking any fiber supplements/ metamicil.   HPI:   [facility] is a 23 y.o. female with past medical history of IBS-D   Patient presenting today for:  Follow up of hematochezia and diarrhea  Last seen by Dr. Cinderella in September, at that time reported moderate amount of blood in stool since giving birth about 6 weeks ago. Reported chronic blood in stools when wiping and liquid stools  Recommended increased water  intake, high fiber diet, anusol  suppository, preparation H and metamucil BID, alpha gal, celiac panel, calprotectin, consider colonoscopy if symptoms persist and labs negative  Labs were not completed  Present: States stomach issues for most of her life, had GB removal in march and feels diarrhea has worsened some. Can have upwards of 3 watery stools per day if she eats something greasy or fatty, otherwise maybe just 1 stool per day. Stools are generally more pudding consistency. No solid stools since GB removal. She has some lower abdominal pain that comes prior to defecation and improves thereafter. She continues to have rectal bleeding, noting blood in the toilet and with wiping. She has pain, itching and burning in rectal area after defecation. Using preparation H and otc hemorrhoid suppositories, She feels these help some. She notes she feels the need to strain with defecation despite not having hard stools. No changes in  appetite, weight has been stable since some weight loss after the birth of her daughter about 4 months ago. She notes she can see hemorrhoids protruding out after having a BM that tend to go back in thereafter. States she never got the anusol  for hemorrhoids after last visit    Last [id number] - The esophagus was normal. - The stomach was normal. - The examined duodenum was normal. Biopsies for histology were taken with a cold forceps for evaluation of celiac disease. - The cardia and gastric fundus were normal on retroflexion.   Last Colonoscopy:2022- diarrhea and hematochezia  - The perianal and digital rectal examinations were normal. - The terminal ileum appeared normal. - The entire examined colon appeared normal on direct and retroflexion views. Biopsies for histology were taken with a cold forceps from the entire colon for evaluation of microscopic colitis.   Surgical [P], colon, random sites - COLONIC MUCOSA WITH NO SPECIFIC HISTOPATHOLOGIC CHANGES - NEGATIVE FOR ACUTE INFLAMMATION, INCREASED INTRAEPITHELIAL LYMPHOCYTES OR THICKENED SUBEPITHELIAL COLLAGEN TABLE 2. 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     phenylephrine -shark liver oil-mineral oil-petrolatum (PREPARATION H) 0.25-14-74.9 % rectal ointment Place 1 Application rectally in the morning and at bedtime. 28 g 0   Prenatal Vit-Fe Fumarate-FA (PRENATAL VITAMIN PO) Take 1 tablet by mouth daily.     psyllium (METAMUCIL) 58.6 % packet Take 1 packet by mouth 2 (two) times daily. (Patient not taking: Reported on 01/09/2024) 60 packet 2   No current facility-administered medications for this visit.    Allergies as of 01/09/2024 - Review Complete 01/09/2024  Allergen Reaction Noted   Prozac  [fluoxetine  hcl] Anaphylaxis and Swelling 07/19/2016   Doxycycline  Nausea And Vomiting 06/11/2022   Penicillins Hives 12/12/2020   Phenergan  [promethazine ]  03/31/2023    Social History   Socioeconomic History   Marital status: Significant Other    Spouse name: Not on file   Number of children: Not on file   Years of education: Not on file   Highest education level: 12th grade  Occupational History   Not on file  Tobacco Use   Smoking status: Never   Smokeless tobacco: Never  Vaping Use   Vaping status: Never Used  Substance and Sexual Activity   Alcohol use: No   Drug use: No   Sexual activity: Not Currently    Birth control/protection: Implant  Other Topics Concern   Not on file  Social History Narrative   Haven attends 6 th grade at [employer]. She is doing average this school year. She enjoys school.   Haven's parents are divorced. She has little contact with her father. She has adult-aged siblings that do not live in the home.    Social Drivers of Health   Tobacco Use: Low Risk (01/09/2024)   Patient History    Smoking Tobacco Use: Never    Smokeless Tobacco Use: Never    Passive Exposure: Not on file  Financial Resource Strain: Low Risk (03/30/2023)   Overall Financial Resource Strain (CARDIA)     Difficulty of Paying Living Expenses: Not hard at all  Recent Concern: Financial Resource Strain - Medium Risk (03/02/2023)   Overall Financial Resource Strain (CARDIA)    Difficulty of Paying Living Expenses: Somewhat hard  Food Insecurity: No Food Insecurity (08/31/2023)   Epic    Worried About Radiation Protection Practitioner of Food in the Last Year: Never true    Ran Out of Food in the Last Year: Never true  Transportation Needs: No Transportation Needs (08/31/2023)   Epic    Lack of Transportation (Medical): No    Lack of Transportation (Non-Medical): No  Physical Activity: Insufficiently Active (03/30/2023)   Exercise Vital Sign    Days of Exercise per Week: 2 days    Minutes of Exercise per Session: 20 min  Stress: Stress Concern Present (03/30/2023)   [employer] of Occupational Health - Occupational Stress Questionnaire    Feeling of Stress : Rather much  Social Connections: Unknown (03/30/2023)   Social Connection and Isolation Panel    Frequency of Communication with Friends and Family: More than  three times a week    Frequency of Social Gatherings with Friends and Family: Once a week    Attends Religious Services: Never    Database Administrator or Organizations: No    Attends Banker Meetings: Never    Marital Status: Patient declined  Recent Concern: Social Connections - Socially Isolated (03/02/2023)   Social Connection and Isolation Panel    Frequency of Communication with Friends and Family: Twice a week    Frequency of Social Gatherings with Friends and Family: Once a week    Attends Religious Services: Never    Database Administrator or Organizations: No    Attends Banker Meetings: Never    Marital Status: Never married  Depression (PHQ2-9): Low Risk (06/16/2023)   Depression (PHQ2-9)    PHQ-2 Score: 4  Recent Concern: Depression (PHQ2-9) - High Risk (03/30/2023)   Depression (PHQ2-9)    PHQ-2 Score: 14  Alcohol Screen: Low Risk (03/02/2023)   Alcohol  Screen    Last Alcohol Screening Score (AUDIT): 0  Housing: Unknown (08/31/2023)   Epic    Unable to Pay for Housing in the Last Year: No    Number of Times Moved in the Last Year: Not on file    Homeless in the Last Year: No  Utilities: Not At Risk (03/03/2023)   [facility] Utilities    Threatened with loss of utilities: No  Health Literacy: Adequate Health Literacy (03/02/2023)   B1300 Health Literacy    Frequency of need for help with medical instructions: Never    Review of systems General: negative for malaise, night sweats, fever, chills, weight loss Neck: Negative for lumps, goiter, pain and significant neck swelling Resp: Negative for cough, wheezing, dyspnea at rest CV: Negative for chest pain, leg swelling, palpitations, orthopnea GI: denies melena, nausea, vomiting, constipation, dysphagia, odyonophagia, early satiety or unintentional weight loss. +hemorrhoids +rectal bleeding +diarrhea  MSK: Negative for joint pain or swelling, back pain, and muscle pain. Derm: Negative for itching or rash Psych: Denies depression, anxiety, memory loss, confusion. No homicidal or suicidal ideation.  Heme: Negative for prolonged bleeding, bruising easily, and swollen nodes. Endocrine: Negative for cold or heat intolerance, polyuria, polydipsia and goiter. Neuro: negative for tremor, gait imbalance, syncope and seizures. The remainder of the review of systems is noncontributory.  Physical Exam: BP 112/78 (BP Location: Left Arm, Patient Position: Sitting, Cuff Size: Large)   Pulse (!) 128   Temp 97.8 F (36.6 C) (Temporal)   Ht 5' 4.25 (1.632 m)   Wt 189 lb 6.4 oz (85.9 kg)   Breastfeeding Yes   BMI 32.26 kg/m  General:   Alert and oriented. No distress noted. Pleasant and cooperative.  Head:  Normocephalic and atraumatic. Eyes:  Conjuctiva clear without scleral icterus. Mouth:  Oral mucosa pink and moist. Good dentition. No lesions. Heart: Normal rate and rhythm, s1 and s2 heart sounds  present.  Lungs: Clear lung sounds in all lobes. Respirations equal and unlabored. Abdomen:  +BS, soft, non-tender and non-distended. No rebound or guarding. No HSM or masses noted. Rectal: deferred Derm: No palmar erythema or jaundice Msk:  Symmetrical without gross deformities. Normal posture. Extremities:  Without edema. Neurologic:  Alert and  oriented x4 Psych:  Alert and cooperative. Normal mood and affect.  Invalid input(s): 6 MONTHS   ASSESSMENT: [facility] is a 23 y.o. female presenting today for follow up of rectal bleeding and diarrhea.   Patient reports ongoing diarrhea and rectal  bleeding noting hemorrhoids protruding out. Blood seen in toilet and when wiping with rectal discomfort/itching after BM. Using only preparation H which helps some. Did not try metamucil, anusol  or have labs done, all which were recommended at last OV. She reports diarrhea and rectal bleeding since prior to her last TCS in 2022 though since GB removal in march, has worsening diarrhea with greasy/fatty foods. I recommend she complete the labs previously ordered by Dr. Cinderella, will also include pancreatic elastase and fecal fat. We discussed that if rectal bleeding and diarrhea persisted and labs were unremarkable, would need to discuss repeat colonoscopy. Briefly discussed hemorrhoid banding but ideally would need a more updated colonoscopy prior to pursuing banding as last was in 2022. Will send proctofoam to try for hemorrhoids in the meantime.    PLAN:  -celiac panel, alpha gal, calprotectin -fecal fat, pancreatic elastase -proctofoam TID  -consider colonoscopy if the above labs are unremarkable and symptoms persist -consider hemorrhoid banding in the future once colonoscopy updated  All questions were answered, patient verbalized understanding and is in agreement with plan as outlined above.   Follow Up: 2 months  Shacola Schussler L. Dajsha Massaro, MSN, APRN, AGNP-C Adult-Gerontology Nurse  Practitioner [facility]  "

## 2024-01-09 NOTE — Patient Instructions (Signed)
 We will check some labs for further evaluation I have sent in proctofoam to use for hemorrhoids Try to avoid straining and limit toilet time as this will worsen hemorrhoids If labs are negative and bleeding persists, we may need to discuss repeat Colonoscopy We can also consider hemorrhoid banding in the future if hemorrhoid persist  Follow up 2 months
